# Patient Record
Sex: Female | Born: 1955 | Race: White | Hispanic: No | Marital: Married | Smoking: Never smoker
Health system: Southern US, Academic
[De-identification: ages and names within clinical notes are randomized; demographics above are authoritative.]

## PROBLEM LIST (undated history)

## (undated) DIAGNOSIS — J45909 Unspecified asthma, uncomplicated: Secondary | ICD-10-CM

## (undated) DIAGNOSIS — I10 Essential (primary) hypertension: Secondary | ICD-10-CM

## (undated) DIAGNOSIS — J449 Chronic obstructive pulmonary disease, unspecified: Secondary | ICD-10-CM

## (undated) DIAGNOSIS — K219 Gastro-esophageal reflux disease without esophagitis: Secondary | ICD-10-CM

## (undated) DIAGNOSIS — G43909 Migraine, unspecified, not intractable, without status migrainosus: Secondary | ICD-10-CM

## (undated) DIAGNOSIS — E119 Type 2 diabetes mellitus without complications: Secondary | ICD-10-CM

## (undated) DIAGNOSIS — J309 Allergic rhinitis, unspecified: Secondary | ICD-10-CM

## (undated) HISTORY — PX: HX OPEN CHOLECYSTECTOMY: SHX57

## (undated) HISTORY — PX: SINUS SURGERY: SHX187

## (undated) HISTORY — DX: Essential (primary) hypertension: I10

## (undated) HISTORY — DX: Chronic obstructive pulmonary disease, unspecified: J44.9

## (undated) HISTORY — PX: HX TONSILLECTOMY: SHX27

## (undated) HISTORY — DX: Unspecified asthma, uncomplicated: J45.909

## (undated) HISTORY — DX: Type 2 diabetes mellitus without complications (CMS HCC): E11.9

## (undated) HISTORY — DX: Gastro-esophageal reflux disease without esophagitis: K21.9

## (undated) HISTORY — DX: Migraine, unspecified, not intractable, without status migrainosus: G43.909

## (undated) HISTORY — DX: Allergic rhinitis, unspecified: J30.9

---

## 2005-09-25 ENCOUNTER — Other Ambulatory Visit (HOSPITAL_COMMUNITY): Payer: Self-pay | Admitting: FAMILY PRACTICE

## 2022-03-19 ENCOUNTER — Other Ambulatory Visit: Payer: 59 | Attending: FAMILY PRACTICE | Admitting: FAMILY PRACTICE

## 2022-03-19 DIAGNOSIS — B379 Candidiasis, unspecified: Secondary | ICD-10-CM | POA: Insufficient documentation

## 2022-03-20 ENCOUNTER — Other Ambulatory Visit: Payer: 59

## 2022-03-22 LAB — THROAT CULTURE, BETA HEMOLYTIC STREPTOCOCCUS: THROAT CULTURE: NORMAL

## 2022-03-24 ENCOUNTER — Other Ambulatory Visit (HOSPITAL_COMMUNITY): Payer: Self-pay | Admitting: FAMILY PRACTICE

## 2022-03-24 DIAGNOSIS — Z1231 Encounter for screening mammogram for malignant neoplasm of breast: Secondary | ICD-10-CM

## 2022-04-08 ENCOUNTER — Encounter (HOSPITAL_COMMUNITY): Payer: Self-pay

## 2022-04-08 ENCOUNTER — Inpatient Hospital Stay
Admission: RE | Admit: 2022-04-08 | Discharge: 2022-04-08 | Disposition: A | Discharge status: Home / Self Care | Payer: 59 | Source: Ambulatory Visit | Attending: FAMILY PRACTICE | Admitting: FAMILY PRACTICE

## 2022-04-08 ENCOUNTER — Other Ambulatory Visit: Payer: Self-pay

## 2022-04-08 DIAGNOSIS — Z1231 Encounter for screening mammogram for malignant neoplasm of breast: Secondary | ICD-10-CM | POA: Insufficient documentation

## 2022-05-26 ENCOUNTER — Other Ambulatory Visit (HOSPITAL_COMMUNITY): Payer: Self-pay | Admitting: Family

## 2022-05-26 DIAGNOSIS — R109 Unspecified abdominal pain: Secondary | ICD-10-CM

## 2022-06-11 ENCOUNTER — Ambulatory Visit (INDEPENDENT_AMBULATORY_CARE_PROVIDER_SITE_OTHER): Payer: 59 | Admitting: NURSE PRACTITIONER

## 2022-06-11 ENCOUNTER — Encounter (INDEPENDENT_AMBULATORY_CARE_PROVIDER_SITE_OTHER): Payer: Self-pay | Admitting: NURSE PRACTITIONER

## 2022-06-11 ENCOUNTER — Other Ambulatory Visit: Payer: Self-pay

## 2022-06-11 VITALS — Ht 64.0 in | Wt 180.0 lb

## 2022-06-11 DIAGNOSIS — K219 Gastro-esophageal reflux disease without esophagitis: Secondary | ICD-10-CM

## 2022-06-11 DIAGNOSIS — J300 Vasomotor rhinitis: Secondary | ICD-10-CM

## 2022-06-11 DIAGNOSIS — K143 Hypertrophy of tongue papillae: Secondary | ICD-10-CM

## 2022-06-11 MED ORDER — CLOTRIMAZOLE 10 MG TROCHE
10.0000 mg | Freq: Every evening | 1 refills | Status: DC
Start: 2022-06-11 — End: 2023-08-19

## 2022-06-11 MED ORDER — IPRATROPIUM BROMIDE 21 MCG (0.03 %) NASAL SPRAY
2.0000 | Freq: Two times a day (BID) | NASAL | 12 refills | Status: DC | PRN
Start: 2022-06-11 — End: 2022-07-03

## 2022-06-19 ENCOUNTER — Other Ambulatory Visit: Payer: Self-pay

## 2022-07-03 ENCOUNTER — Other Ambulatory Visit (INDEPENDENT_AMBULATORY_CARE_PROVIDER_SITE_OTHER): Payer: Self-pay | Admitting: NURSE PRACTITIONER

## 2022-07-09 ENCOUNTER — Other Ambulatory Visit: Payer: 59 | Attending: FAMILY PRACTICE

## 2022-07-09 ENCOUNTER — Other Ambulatory Visit: Payer: Self-pay

## 2022-07-09 DIAGNOSIS — R634 Abnormal weight loss: Secondary | ICD-10-CM | POA: Insufficient documentation

## 2022-07-09 DIAGNOSIS — T7840XA Allergy, unspecified, initial encounter: Secondary | ICD-10-CM | POA: Insufficient documentation

## 2022-07-09 DIAGNOSIS — I10 Essential (primary) hypertension: Secondary | ICD-10-CM | POA: Insufficient documentation

## 2022-07-09 DIAGNOSIS — E119 Type 2 diabetes mellitus without complications: Secondary | ICD-10-CM | POA: Insufficient documentation

## 2022-07-09 LAB — BASIC METABOLIC PANEL
ANION GAP: 9 mmol/L — ABNORMAL LOW (ref 10–20)
BUN/CREA RATIO: 19 (ref 6–22)
BUN: 19 mg/dL (ref 7–25)
CALCIUM: 9.7 mg/dL (ref 8.6–10.3)
CHLORIDE: 105 mmol/L (ref 98–107)
CO2 TOTAL: 25 mmol/L (ref 21–31)
CREATININE: 0.98 mg/dL (ref 0.60–1.30)
ESTIMATED GFR: 64 mL/min/{1.73_m2} (ref >59)
GLUCOSE: 91 mg/dL (ref 74–109)
OSMOLALITY, CALCULATED: 279 mOsm/kg (ref 270–290)
POTASSIUM: 4.2 mmol/L (ref 3.5–5.1)
SODIUM: 139 mmol/L (ref 136–145)

## 2022-07-09 LAB — HEPATIC FUNCTION PANEL
ALBUMIN/GLOBULIN RATIO: 1.8 — ABNORMAL HIGH (ref 0.8–1.4)
ALBUMIN: 4.4 g/dL (ref 3.5–5.7)
ALKALINE PHOSPHATASE: 69 U/L (ref 34–104)
ALT (SGPT): 25 U/L (ref 7–52)
AST (SGOT): 18 U/L (ref 13–39)
BILIRUBIN DIRECT: 0.06 md/dL (ref <=0.20)
BILIRUBIN TOTAL: 0.3 mg/dL (ref 0.3–1.2)
BILIRUBIN, INDIRECT: 0.24 mg/dL (ref <=1)
GLOBULIN: 2.4 — ABNORMAL LOW (ref 2.9–5.4)
PROTEIN TOTAL: 6.8 g/dL (ref 6.4–8.9)

## 2022-07-09 LAB — THYROID STIMULATING HORMONE (SENSITIVE TSH): TSH: 2.539 u[IU]/mL (ref 0.450–5.330)

## 2022-07-10 LAB — HGA1C (HEMOGLOBIN A1C WITH EST AVG GLUCOSE): HEMOGLOBIN A1C: 5.4 % (ref 4.0–6.0)

## 2022-07-29 ENCOUNTER — Encounter (INDEPENDENT_AMBULATORY_CARE_PROVIDER_SITE_OTHER): Payer: Self-pay | Admitting: NURSE PRACTITIONER

## 2022-08-05 ENCOUNTER — Ambulatory Visit (HOSPITAL_COMMUNITY): Payer: Self-pay

## 2022-08-26 ENCOUNTER — Other Ambulatory Visit: Payer: Self-pay

## 2022-08-26 ENCOUNTER — Ambulatory Visit (INDEPENDENT_AMBULATORY_CARE_PROVIDER_SITE_OTHER): Payer: 59 | Admitting: NURSE PRACTITIONER

## 2022-08-26 ENCOUNTER — Encounter (INDEPENDENT_AMBULATORY_CARE_PROVIDER_SITE_OTHER): Payer: Self-pay | Admitting: NURSE PRACTITIONER

## 2022-08-26 VITALS — Ht 64.0 in | Wt 180.0 lb

## 2022-08-26 DIAGNOSIS — J300 Vasomotor rhinitis: Secondary | ICD-10-CM

## 2022-08-26 DIAGNOSIS — K219 Gastro-esophageal reflux disease without esophagitis: Secondary | ICD-10-CM

## 2022-08-26 DIAGNOSIS — K146 Glossodynia: Secondary | ICD-10-CM

## 2022-08-26 MED ORDER — DESLORATADINE 5 MG TABLET
5.0000 mg | ORAL_TABLET | Freq: Every day | ORAL | 3 refills | Status: DC
Start: 2022-08-26 — End: 2022-09-17

## 2022-08-26 NOTE — Progress Notes (Signed)
ENT, [facility]  [address]  [place] [address]  Phone: [phone]  Fax: [phone]      Encounter Date: 08/26/2022    Patient ID: Heather Atkins  MRN: [id number]    DOB: 1956-10-09  Age: 66 y.o. female     Progress Note       Referring Provider:  No ref. provider found    Reason for Visit:   Chief Complaint   Patient presents with    Acid Reflux     6 week rc, patient complains of PND, sore throat, hoarseness and cough        History of Present Illness:  Heather Atkins is a 66 y.o. female follow up burning tongue syndrome.  Last visit I started her on Clotrimazole lozenges nightly.  Continues to have a burning sensation in mouth and throat at times.  Laryngoscopy showed LPR changes and advised to continue management as directed by GI.  I also started her on Atrovent nasal spray for runny nose and this has helped some.    06/11/22  History of Present Illness:  Heather Atkins is a 66 y.o. female referred for burning mouth syndrome.  She complains of a film on tongue for 3 months.  Treated with nystatin several times and no improvement.  She has history of chronic sinus symptoms most of her life and feels that PND could be causing film on her tongue.  Has been taking Singulair for several years and unsure if this helping.  Allergy testing in January by Dr. Sherilyn Banker was positive to a few molds.  Diagnosed with Barrett's Esophagus EGD March Dr. Allena Katz and follow up in September. Last A1C 5.5  Patient History:  There is no problem list on file for this patient.    Current Outpatient Medications   Medication Sig    allopurinoL (ZYLOPRIM) 100 mg Oral Tablet Take 1 Tablet (100 mg total) by mouth Once a day    amitriptyline (ELAVIL) 50 mg Oral Tablet Take 1 Tablet (50 mg total) by mouth Every night    atorvastatin (LIPITOR) 20 mg Oral Tablet Take 1 Tablet (20 mg total) by mouth Once a day    Butalbital-Acetaminophen-Caff 50-300-40 mg Oral Capsule TAKE 1 CAPSULE BY MOUTH TWICE A DAY AS NEEDED    clobetasoL  (CORMAX) 0.05 % Solution APPLY TO AREAS ON SCALP AT BEDTIME    clotrimazole (MYCELEX) 10 mg Mucous Membrane Troche Take 1 Troche (10 mg total) by mouth Every night    desloratadine (CLARINEX) 5 mg Oral Tablet Take 1 Tablet (5 mg total) by mouth Once a day    famotidine (PEPCID) 40 mg Oral Tablet Take 1 Tablet (40 mg total) by mouth Every morning    ipratropium bromide (ATROVENT) 21 mcg (0.03 %) Nasal nasal spray ADMINISTER 2 SPRAYS INTO AFFECTED NOSTRIL(S) TWICE PER DAY AS NEEDED    losartan (COZAAR) 50 mg Oral Tablet Take 1 Tablet (50 mg total) by mouth Twice daily    metFORMIN (GLUCOPHAGE) 500 mg Oral Tablet Take 1 Tablet (500 mg total) by mouth Twice daily    montelukast (SINGULAIR) 10 mg Oral Tablet Take 1 Tablet (10 mg total) by mouth Once a day    omeprazole (PRILOSEC) 40 mg Oral Capsule, Delayed Release(E.C.) TAKE 1 CAPSULE BY MOUTH EVERY DAY BEFORE DINNER      Allergies   Allergen Reactions    Cat Dander     Cockroach     Dog Dander     Grass Pollen  Mold     Tetanus Vaccines And Toxoid     Tree And Shrub Pollen      Past Medical History:   Diagnosis Date    Asthma     COPD (chronic obstructive pulmonary disease) (CMS HCC)     Diabetes mellitus (CMS HCC)     Esophageal reflux     Essential hypertension     Migraine      Past Surgical History:   Procedure Laterality Date    HX OPEN CHOLECYSTECTOMY      HX TONSILLECTOMY      SINUS SURGERY       Family Medical History:       Problem Relation (Age of Onset)    Asthma Other    Breast Cancer Paternal Aunt    Cancer Other    Hearing Loss Other    Migraines Other    Sleep apnea Other    Thyroid Disease Other            Social History     Tobacco Use    Smoking status: Never    Smokeless tobacco: Never       Review of Systems     Vitals:    08/26/22 0913   Weight: 81.6 kg (180 lb)   Height: 1.626 m (5\' 4" )   BMI: 30.96      ENT Physical Exam  Constitutional  Appearance: patient appears well-developed, well-nourished and well-groomed,  Communication/Voice:  communication appropriate for developmental age; vocal quality normal;  Head and Face  Appearance: head appears normal, face appears normal and face appears atraumatic;  Palpation: facial palpation normal;  Salivary: glands normal;  Ear  Hearing: intact;  Auricles: right auricle normal; left auricle normal;  External Mastoids: right external mastoid normal; left external mastoid normal;  Ear Canals: right ear canal normal; left ear canal normal;  Tympanic Membranes: right tympanic membrane normal; left tympanic membrane normal;  Nose  External Nose: nares patent bilaterally; external nose normal;  Internal Nose: nasal mucosa normal; septum normal; bilateral inferior turbinates normal;  Oral Cavity/Oropharynx  Lips: normal;  Teeth: normal;  Gums: gingiva normal;  Tongue: normal;  Oral mucosa: normal;  Hard palate: normal;  OC/OP comments: Tongue with slight coating and papillae hypertrophy  Neck  Neck: neck normal; neck palpation normal;  Thyroid: thyroid normal;  Respiratory  Inspection: breathing unlabored; normal breathing rate;  Lymphatic  Palpation: lymph nodes normal;  Neurovestibular  Mental Status: alert and oriented;  Psychiatric: mood normal; affect is appropriate;  Cranial Nerves: cranial nerves intact;       Assessment:  ENCOUNTER DIAGNOSES     ICD-10-CM   1. Burning tongue syndrome  K14.6   2. 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  Labs as ordered for burning tongue syndrome, will call her with results  Clarinex 5 mg daily for AR symptoms, can continue Atrovent as needed  Avoid sugar in diet as this could be contributing to burning tongue syndrome      Orders Placed This Encounter    GLUCOSE FASTING    VITAMIN B6 (PYRIDOXAL 5-PHOSPHATE), PLASMA    ZINC, SERUM    THYROID STIMULATING HORMONE (SENSITIVE TSH)    VITAMIN D 25 TOTAL    VITAMIN B12    IRON    desloratadine (CLARINEX) 5 mg Oral Tablet     No follow-ups on  file.  Elnora Morrison, FNP-BC  08/26/2022, 09:33

## 2022-09-11 IMAGING — US US ABDOMEN COMPLETE
1 series · 14 of 25 positions shown · non-contrast
Comparison: None available.

﻿EXAM: US ABDOMEN COMPLETE
INDICATION: Abdominal pain.

[Series 1: us abdomen complete · 14 of 61 slices shown]
[im 1/61]
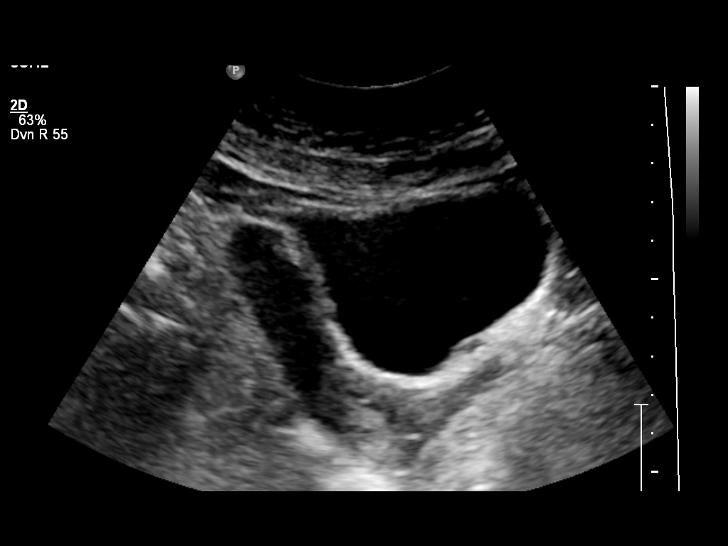
[im 6/61]
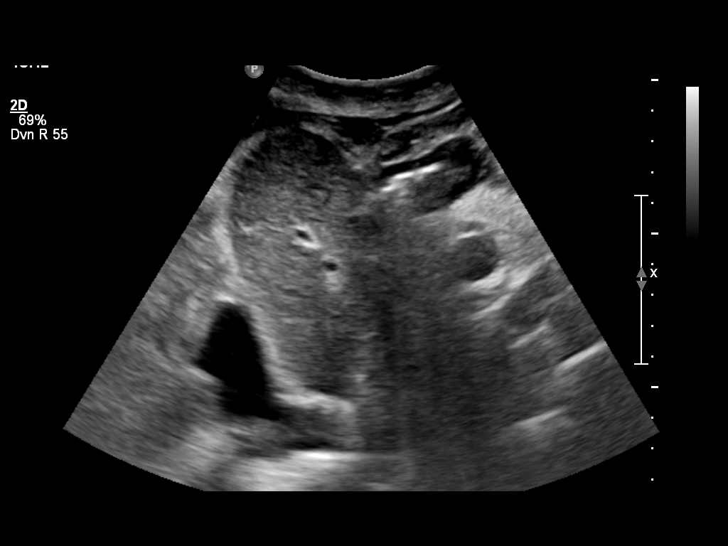
[im 11/61]
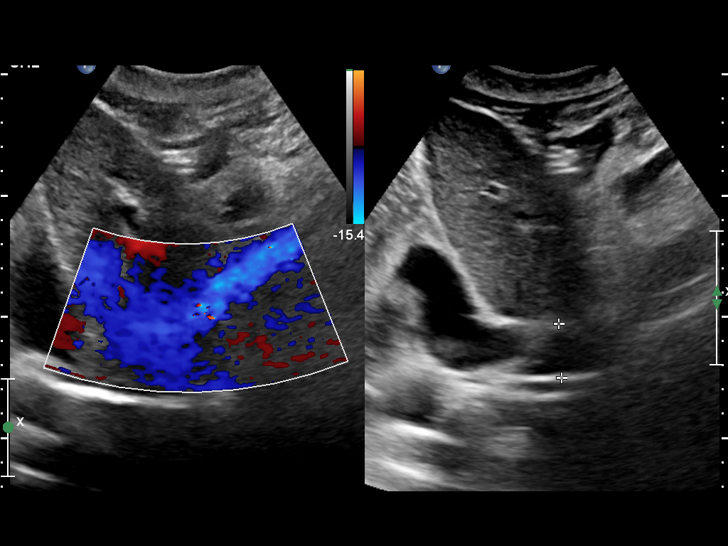
[im 16/61]
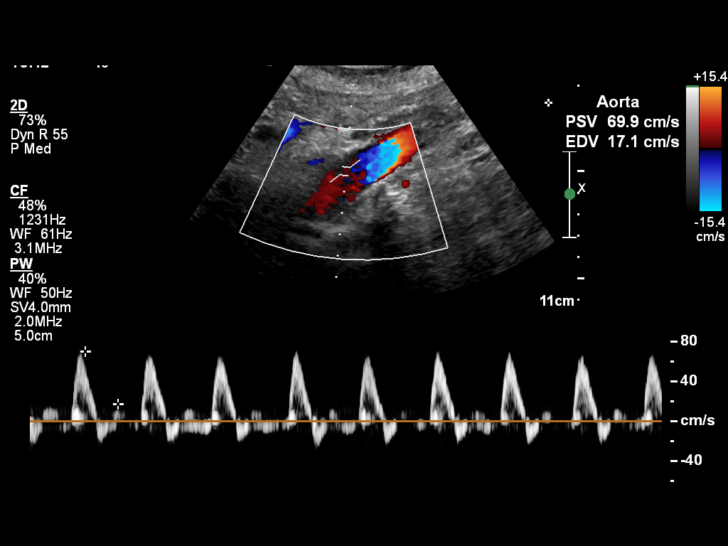
[im 21/61]
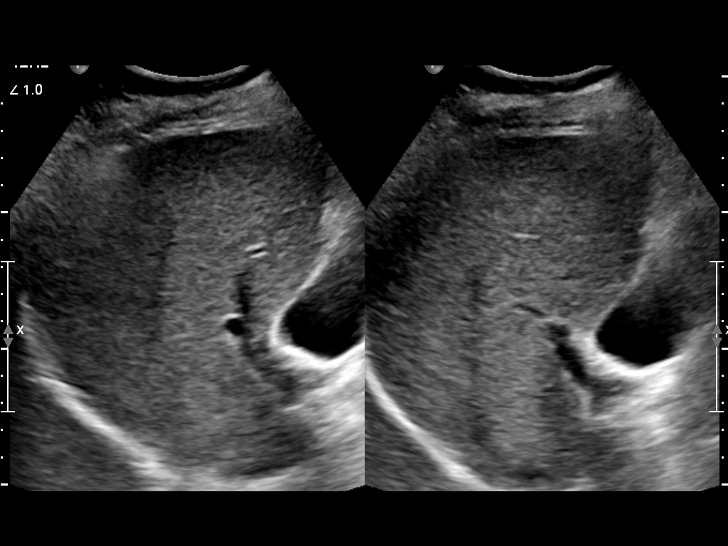
[im 23/61]
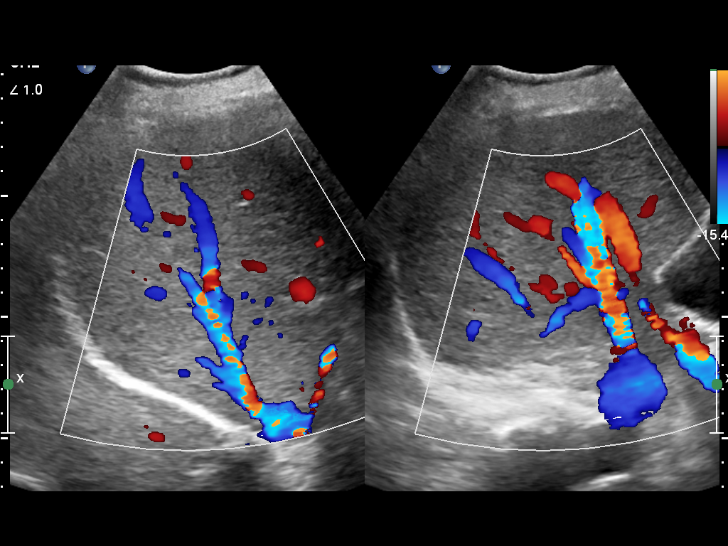
[im 28/61]
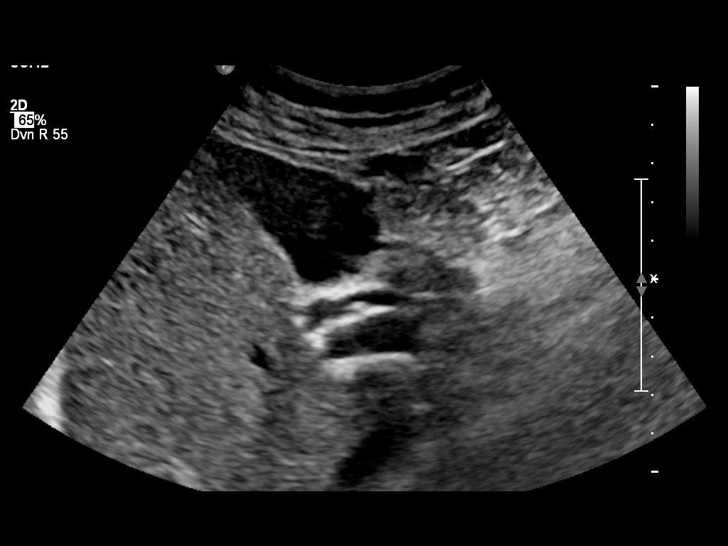
[im 33/61]
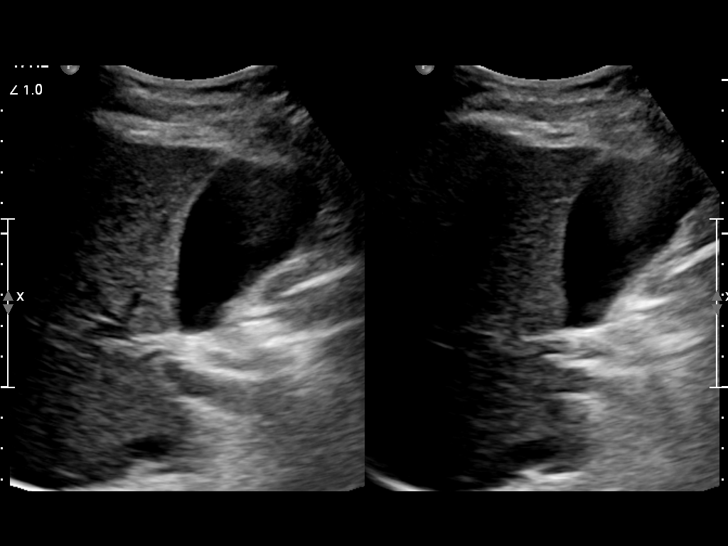
[im 38/61]
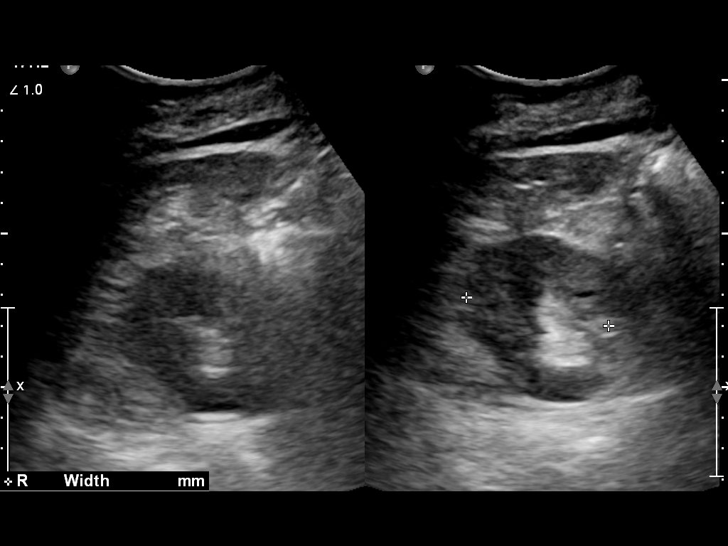
[im 41/61]
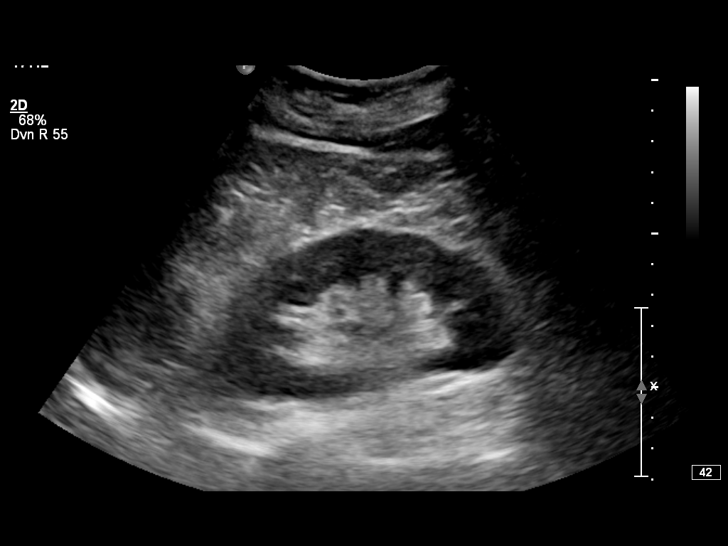
[im 46/61]
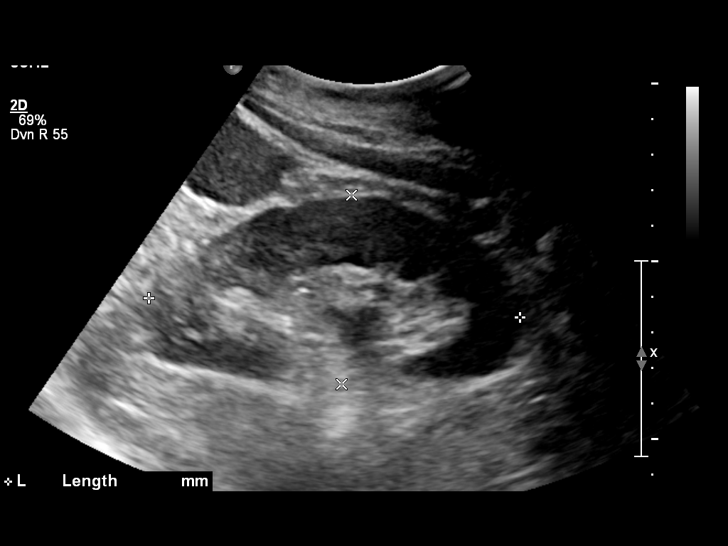
[im 51/61]
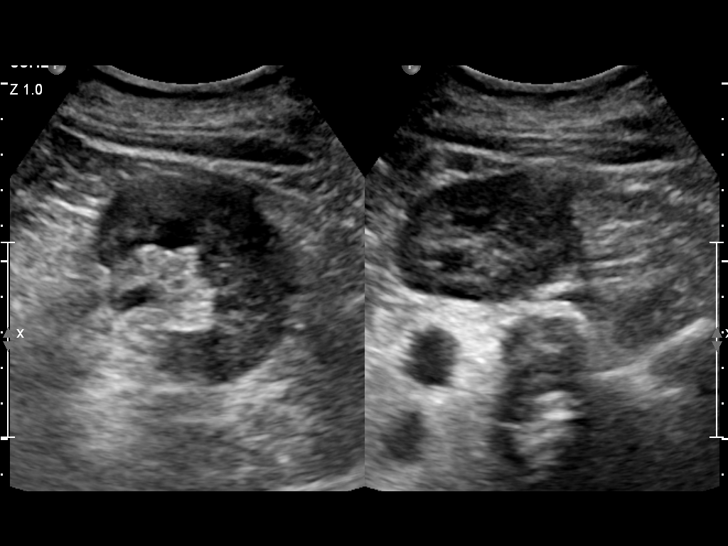
[im 56/61]
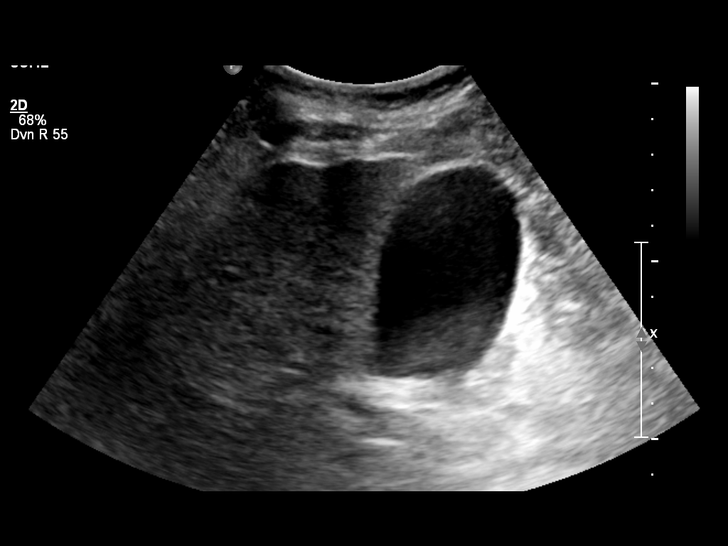
[im 61/61]
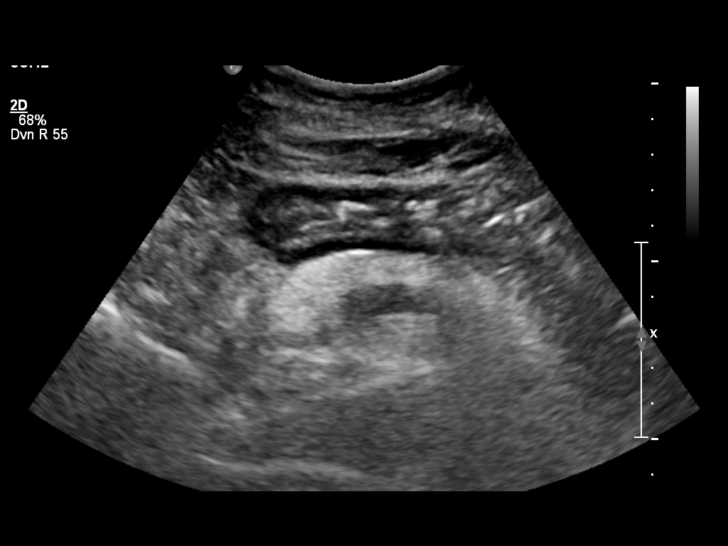

[14 of 25 positions shown; findings below may reference images not displayed]

FINDINGS: No abnormalities of the gallbladder. Extrahepatic bile ducts are normal in size.

Mildly contracted liver measuring 13 cm in craniocaudal span.  Heterogeneous echotexture of liver and suspected nodular surface of liver. Portal vein and hepatic veins are patent.

Spleen size is normal measuring 11.5 cm in length.  Kidneys are normal. Aorta, vena cava and pancreas are unremarkable to the extent visualized.  There is no ascites.
IMPRESSION: 1. No abnormalities of gallbladder and extrahepatic bile ducts.

2. Mildly contracted liver with heterogeneous echotexture.  Suspected nodular surface of liver.  Portal vein and hepatic veins are patent.  Possible changes of chronic liver disease.  Please correlate with liver function results.

3. Normal size spleen. No free fluid noted.

## 2022-09-17 ENCOUNTER — Other Ambulatory Visit (INDEPENDENT_AMBULATORY_CARE_PROVIDER_SITE_OTHER): Payer: Self-pay | Admitting: NURSE PRACTITIONER

## 2022-09-24 ENCOUNTER — Other Ambulatory Visit: Payer: 59 | Attending: FAMILY PRACTICE

## 2022-09-24 ENCOUNTER — Other Ambulatory Visit: Payer: Self-pay

## 2022-09-24 DIAGNOSIS — Z01419 Encounter for gynecological examination (general) (routine) without abnormal findings: Secondary | ICD-10-CM | POA: Insufficient documentation

## 2022-10-14 LAB — THIN PREP PAP AND HPV MRNA (QUEST): HPV MRNA E6/E7: NOT DETECTED

## 2022-10-17 ENCOUNTER — Ambulatory Visit (HOSPITAL_COMMUNITY): Payer: Self-pay

## 2023-01-11 ENCOUNTER — Other Ambulatory Visit (INDEPENDENT_AMBULATORY_CARE_PROVIDER_SITE_OTHER): Payer: Self-pay | Admitting: NURSE PRACTITIONER

## 2023-02-13 ENCOUNTER — Other Ambulatory Visit (HOSPITAL_COMMUNITY): Payer: Self-pay | Admitting: FAMILY PRACTICE

## 2023-02-13 ENCOUNTER — Other Ambulatory Visit: Payer: 59 | Attending: FAMILY PRACTICE

## 2023-02-13 ENCOUNTER — Other Ambulatory Visit: Payer: Self-pay

## 2023-02-13 DIAGNOSIS — Z79899 Other long term (current) drug therapy: Secondary | ICD-10-CM | POA: Insufficient documentation

## 2023-02-13 DIAGNOSIS — Z1231 Encounter for screening mammogram for malignant neoplasm of breast: Secondary | ICD-10-CM

## 2023-02-13 LAB — DRUG SCREEN, NO CONFIRMATION, URINE
AMPHET QL: NEGATIVE
BARB QL: NEGATIVE
BENZO QL: NEGATIVE
BUP QL: NEGATIVE
CANNAQL: NEGATIVE
COCQL: NEGATIVE
FENTANYL, RANDOM URINE: NEGATIVE
METHQL: NEGATIVE
OPIATE: NEGATIVE
OXYCODONE URINE: NEGATIVE
PCP QL: NEGATIVE

## 2023-02-23 ENCOUNTER — Other Ambulatory Visit (HOSPITAL_COMMUNITY): Payer: Self-pay | Admitting: FAMILY PRACTICE

## 2023-02-23 DIAGNOSIS — M25512 Pain in left shoulder: Secondary | ICD-10-CM

## 2023-03-04 ENCOUNTER — Other Ambulatory Visit (HOSPITAL_COMMUNITY): Payer: 59

## 2023-03-05 ENCOUNTER — Encounter (HOSPITAL_COMMUNITY): Payer: Self-pay

## 2023-03-09 IMAGING — MR MRI SHOULDER LT W/O CONTRAST
6 series · 39 of 40 positions shown · IV contrast (gadolinium)
Comparison: None available.

﻿EXAM:  [id number]   MRI SHOULDER LT W/O CONTRAST
INDICATION: Left shoulder pain. Diminished range of motion. Trauma due to fall 1 month ago.
TECHNIQUE: Multiplanar, multisequential MRI of the left shoulder was performed without gadolinium contrast.

[Series 6: T1 · oblique · left · 3.5mm · 0.33mm/px · 7 of 18 slices shown]
[im 1/18]
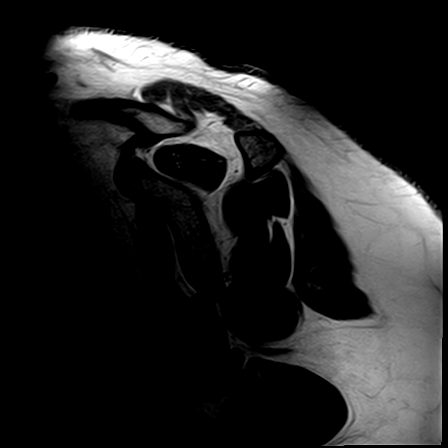
[im 3/18]
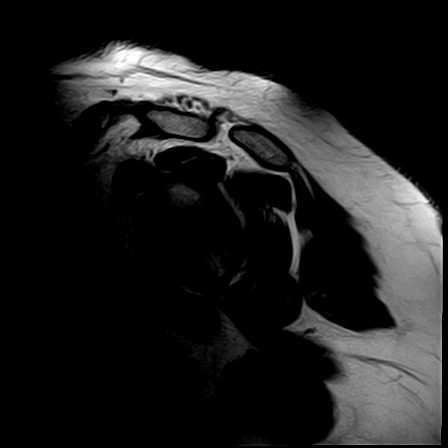
[im 6/18]
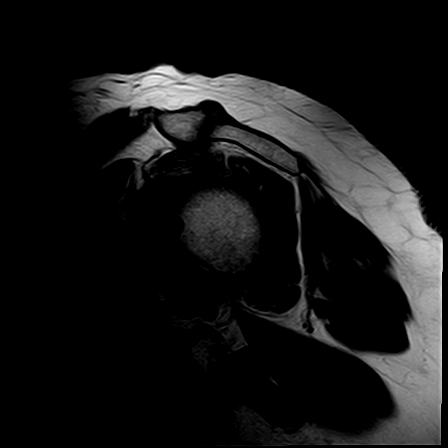
[im 9/18]
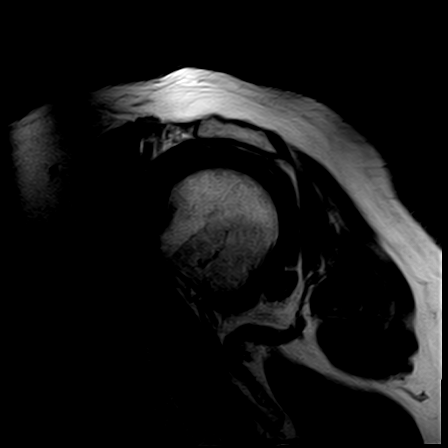
[im 12/18]
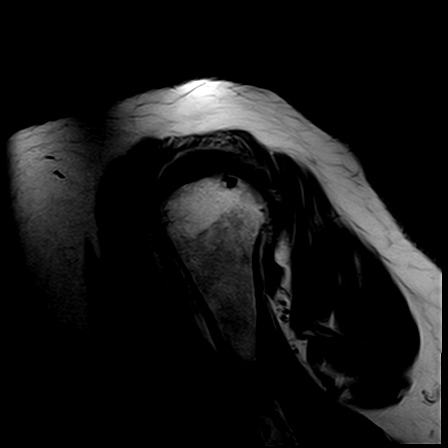
[im 15/18]
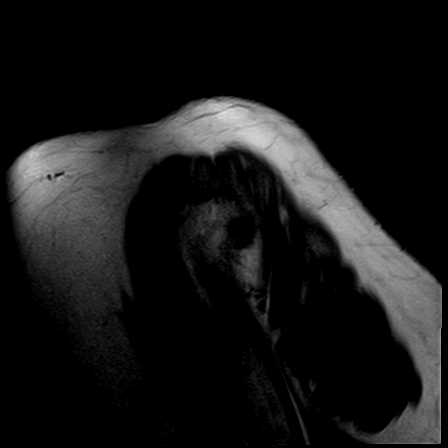
[im 18/18]
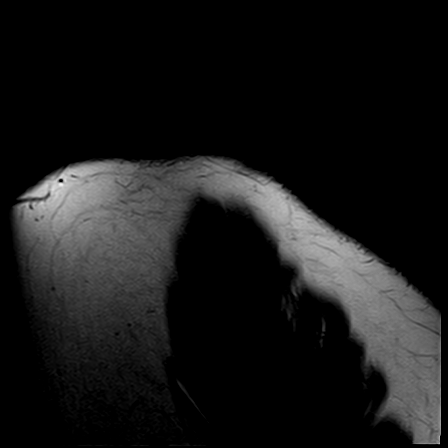

[Series 7: STIR · oblique · left · 3.5mm · 0.47mm/px · 7 of 18 slices shown (1 of 2)]
[im 1/18]
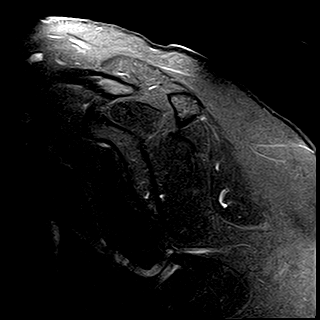
[im 3/18]
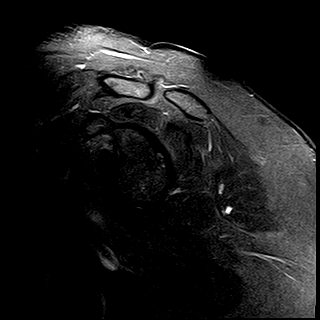
[im 6/18]
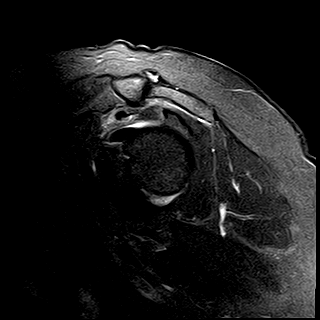
[im 9/18]
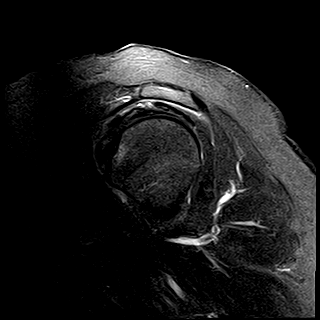
[im 12/18]
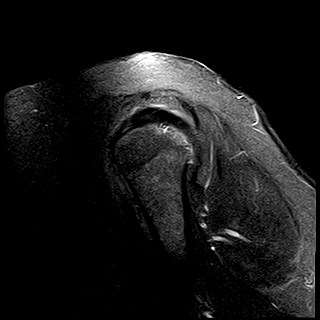
[im 15/18]
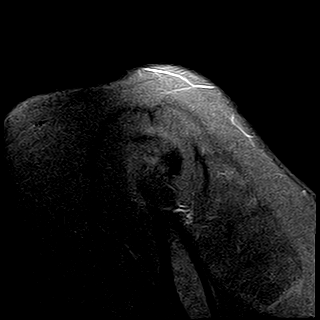
[im 18/18]
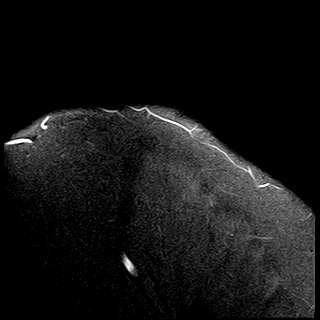

[Series 8: PD fat-sat · axial · left · 4.0mm · 0.50mm/px · z∈[-49,+26]mm · 6 of 18 slices shown (1 of 2)]
[im 1/18]
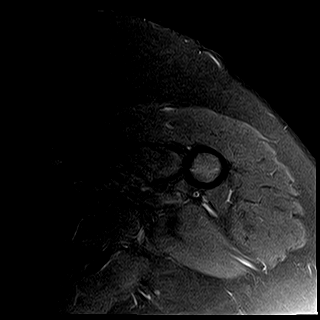
[im 4/18]
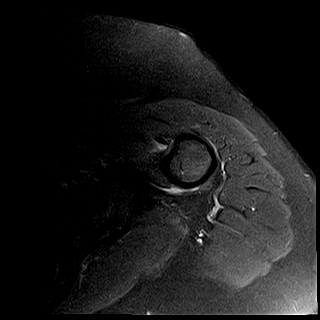
[im 7/18]
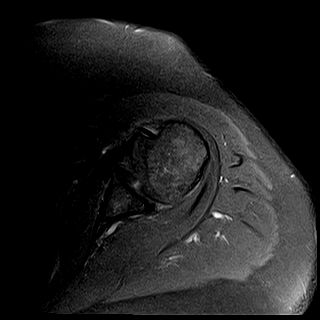
[im 11/18]
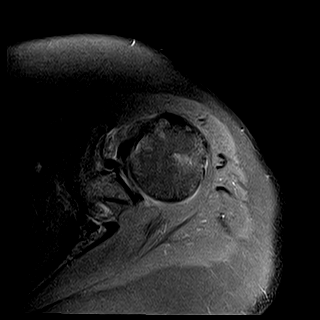
[im 14/18]
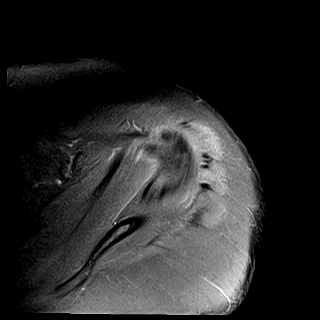
[im 18/18]
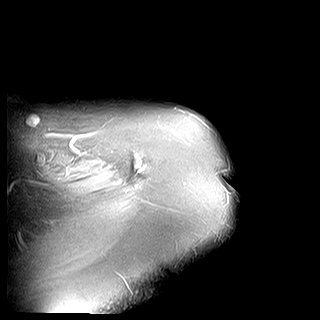

[Series 9: T2 fat-sat · axial · left · 4.0mm · 0.42mm/px · z∈[-63,+39]mm · 8 of 24 slices shown]
[im 1/24]
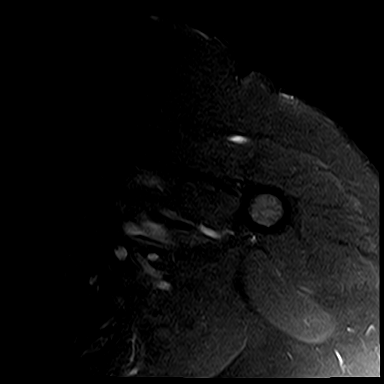
[im 4/24]
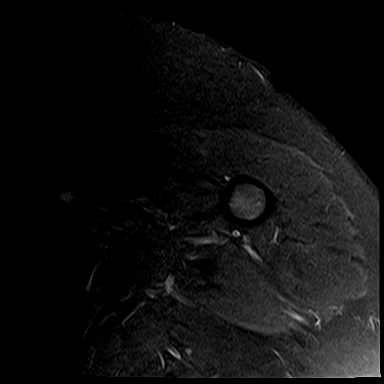
[im 7/24]
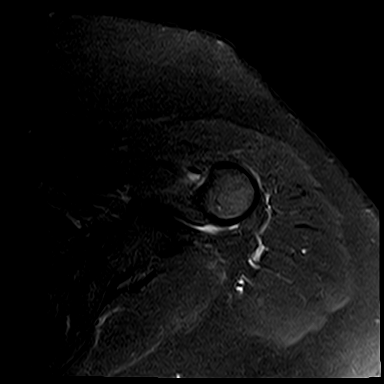
[im 10/24]
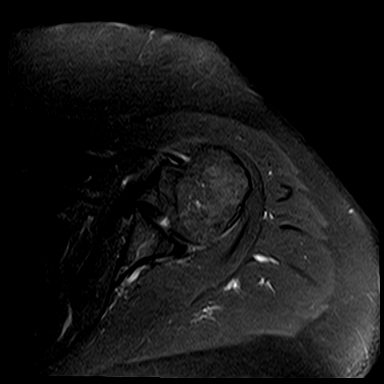
[im 14/24]
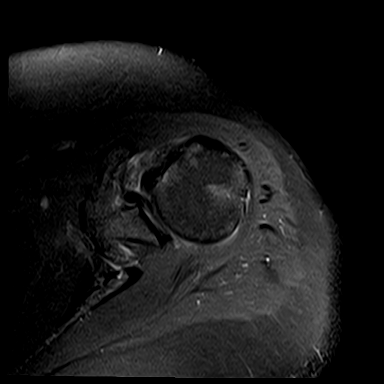
[im 17/24]
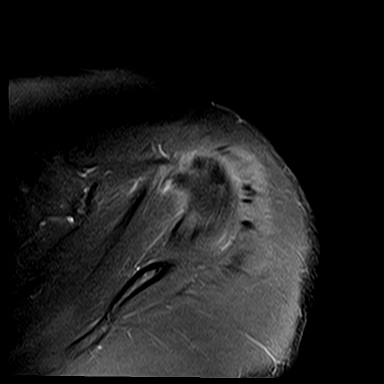
[im 20/24]
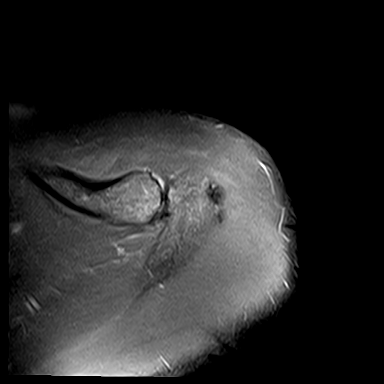
[im 24/24]
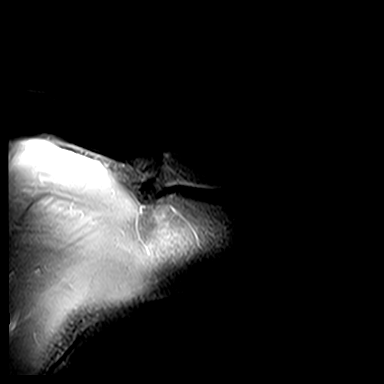

[Series 10: PD fat-sat · oblique · left · 3.5mm · 0.47mm/px · 6 of 18 slices shown (2 of 2)]
[im 1/18]
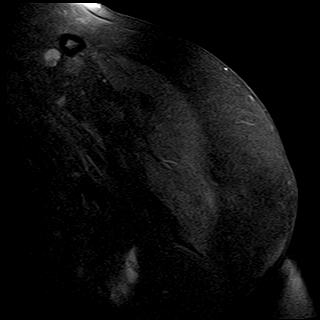
[im 4/18]
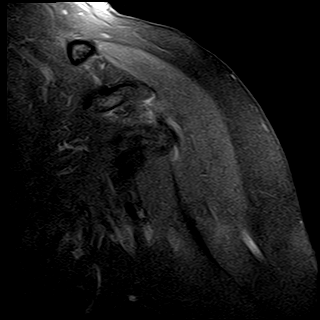
[im 7/18]
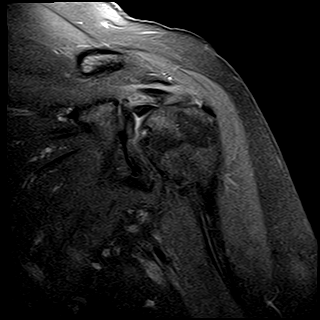
[im 11/18]
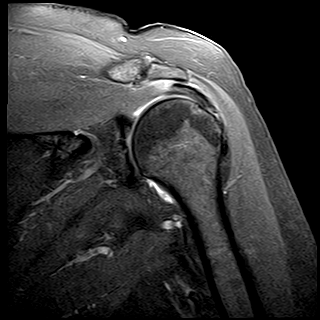
[im 14/18]
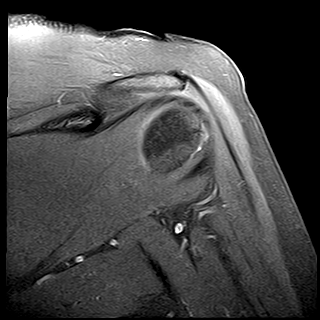
[im 18/18]
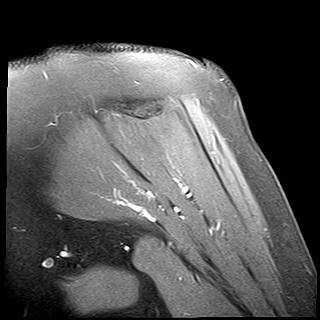

[Series 11: STIR · oblique · left · 3.5mm · 0.47mm/px · 5 of 18 slices shown (2 of 2)]
[im 1/18]
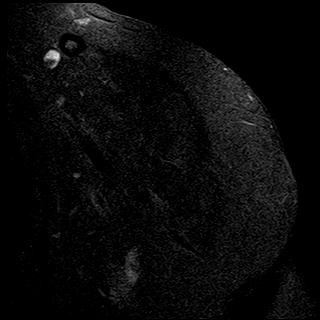
[im 4/18]
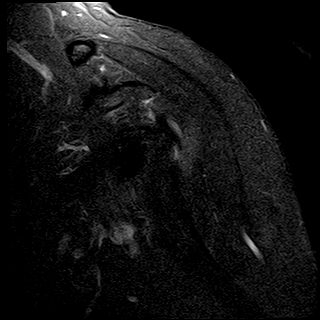
[im 7/18]
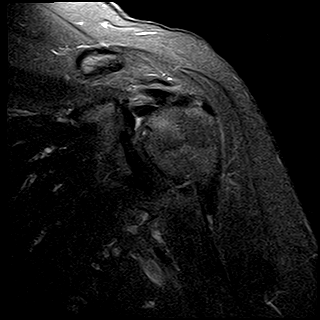
[im 11/18]
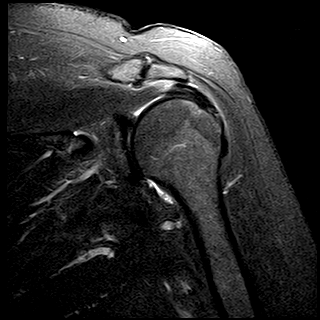
[im 14/18]
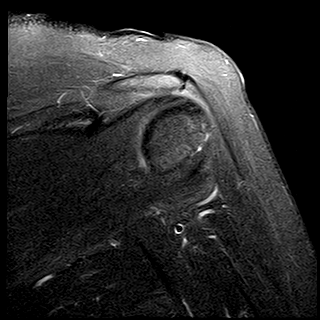

[39 of 40 positions shown; findings below may reference images not displayed]

FINDINGS: No acute fractures or bone bruises are seen at the left shoulder.

Degenerative changes of AC joint are impinging on the subacromion space and the supraspinatus.  Mild supraspinatus tendinitis is noted.  No evidence of rotator cuff tear.

Mild degenerative changes of glenohumeral articulation.  No evidence of glenoid labral tear.  Long head of biceps tendon is intact.
IMPRESSION: 1. No acute bone changes at the left shoulder. 

2. Degenerative changes of AC joint mildly impinging on the supraspinatus in the subacromion region.  Mild supraspinatus tendinopathy.  No evidence of rotator cuff tear.

3. Degenerative changes of glenohumeral joint.  No evidence of labral tear.

## 2023-04-13 ENCOUNTER — Ambulatory Visit (HOSPITAL_COMMUNITY): Payer: Self-pay

## 2023-04-22 ENCOUNTER — Inpatient Hospital Stay
Admission: RE | Admit: 2023-04-22 | Discharge: 2023-04-22 | Disposition: A | Discharge status: Home / Self Care | Payer: 59 | Source: Ambulatory Visit | Attending: FAMILY PRACTICE | Admitting: FAMILY PRACTICE

## 2023-04-22 ENCOUNTER — Encounter (HOSPITAL_COMMUNITY): Payer: Self-pay

## 2023-04-22 ENCOUNTER — Other Ambulatory Visit: Payer: Self-pay

## 2023-04-22 DIAGNOSIS — Z1231 Encounter for screening mammogram for malignant neoplasm of breast: Secondary | ICD-10-CM | POA: Insufficient documentation

## 2023-06-24 ENCOUNTER — Inpatient Hospital Stay
Admission: RE | Admit: 2023-06-24 | Discharge: 2023-06-24 | Disposition: A | Discharge status: Home / Self Care | Payer: 59 | Source: Ambulatory Visit | Attending: FAMILY PRACTICE | Admitting: FAMILY PRACTICE

## 2023-06-24 ENCOUNTER — Other Ambulatory Visit: Payer: Self-pay

## 2023-06-24 ENCOUNTER — Other Ambulatory Visit (HOSPITAL_COMMUNITY): Payer: Self-pay | Admitting: FAMILY PRACTICE

## 2023-06-24 ENCOUNTER — Other Ambulatory Visit (HOSPITAL_COMMUNITY): Payer: 59 | Admitting: FAMILY PRACTICE

## 2023-06-24 ENCOUNTER — Ambulatory Visit (INDEPENDENT_AMBULATORY_CARE_PROVIDER_SITE_OTHER): Payer: Self-pay

## 2023-06-24 ENCOUNTER — Encounter (INDEPENDENT_AMBULATORY_CARE_PROVIDER_SITE_OTHER): Payer: Self-pay

## 2023-06-24 DIAGNOSIS — R609 Edema, unspecified: Secondary | ICD-10-CM | POA: Insufficient documentation

## 2023-06-24 DIAGNOSIS — F419 Anxiety disorder, unspecified: Secondary | ICD-10-CM | POA: Insufficient documentation

## 2023-06-24 DIAGNOSIS — M25579 Pain in unspecified ankle and joints of unspecified foot: Secondary | ICD-10-CM

## 2023-06-24 DIAGNOSIS — Z8269 Family history of other diseases of the musculoskeletal system and connective tissue: Secondary | ICD-10-CM | POA: Insufficient documentation

## 2023-06-24 DIAGNOSIS — K219 Gastro-esophageal reflux disease without esophagitis: Secondary | ICD-10-CM | POA: Insufficient documentation

## 2023-06-24 DIAGNOSIS — M25473 Effusion, unspecified ankle: Secondary | ICD-10-CM | POA: Insufficient documentation

## 2023-06-24 LAB — CBC WITH DIFF
BASOPHIL #: 0.1 10*3/uL (ref 0.00–0.10)
BASOPHIL %: 1 % (ref 0–1)
EOSINOPHIL #: 0.1 10*3/uL (ref 0.00–0.50)
EOSINOPHIL %: 2 %
HCT: 43.1 % — ABNORMAL HIGH (ref 31.2–41.9)
HGB: 14.5 g/dL — ABNORMAL HIGH (ref 10.9–14.3)
LYMPHOCYTE #: 2.1 10*3/uL (ref 1.00–3.00)
LYMPHOCYTE %: 34 % (ref 16–44)
MCH: 32 pg (ref 24.7–32.8)
MCHC: 33.7 g/dL (ref 32.3–35.6)
MCV: 95 fL (ref 75.5–95.3)
MONOCYTE #: 0.4 10*3/uL (ref 0.30–1.00)
MONOCYTE %: 7 % (ref 5–13)
MPV: 9.2 fL (ref 7.9–10.8)
NEUTROPHIL #: 3.4 10*3/uL (ref 1.85–7.80)
NEUTROPHIL %: 56 % (ref 43–77)
PLATELETS: 227 10*3/uL (ref 140–440)
RBC: 4.54 10*6/uL (ref 3.63–4.92)
RDW: 12.4 % (ref 12.3–17.7)
WBC: 6.1 10*3/uL (ref 3.8–11.8)

## 2023-06-24 LAB — URIC ACID: URIC ACID: 7.3 mg/dL (ref 2.3–7.6)

## 2023-07-14 IMAGING — MR MRI ANKLE LT WO CONTAST
4 of 6 series · 23 of 40 positions shown · IV contrast (gadolinium)
Comparison: Outside x-rays dated 06/24/2023.

﻿EXAM:  [id number]   MRI ANKLE LT WO CONTAST
INDICATION: 67-year-old with persistent medial left ankle pain and swelling.  No history of trauma or previous surgery of the ankle.
TECHNIQUE: Multiplanar, multisequential MRI of the left ankle was performed without gadolinium contrast.

[Series 8: T1 · sagittal · left · 3.5mm · 0.38mm/px · 5 of 20 slices shown (1 of 3)]
[im 1/20]
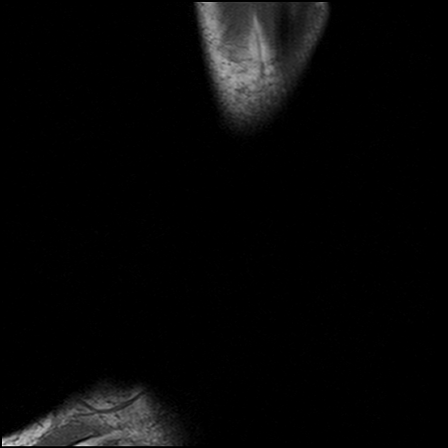
[im 5/20]
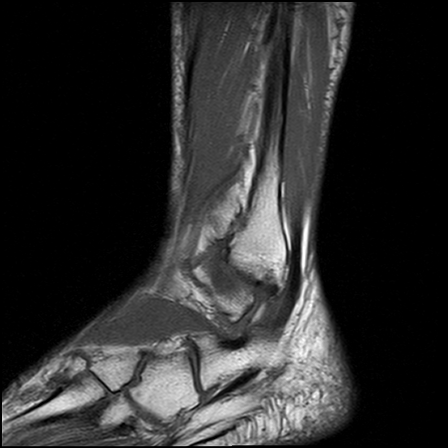
[im 10/20]
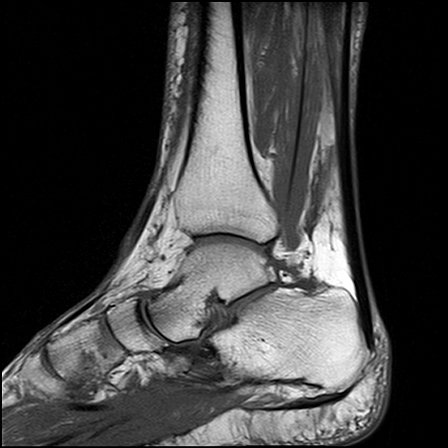
[im 15/20]
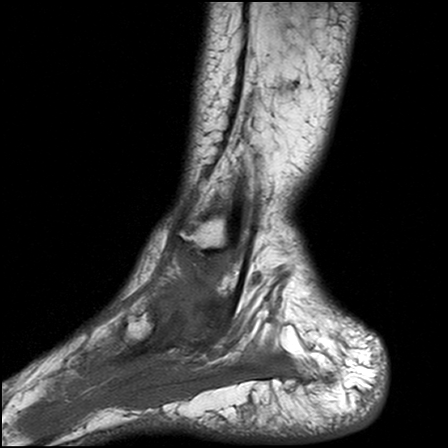
[im 20/20]
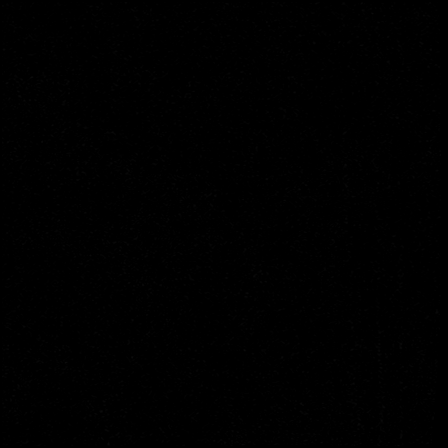

[Series 10: T1 · axial · left · 4.5mm · 0.33mm/px · z∈[-95,+20]mm · 7 of 28 slices shown (2 of 3)]
[im 1/28]
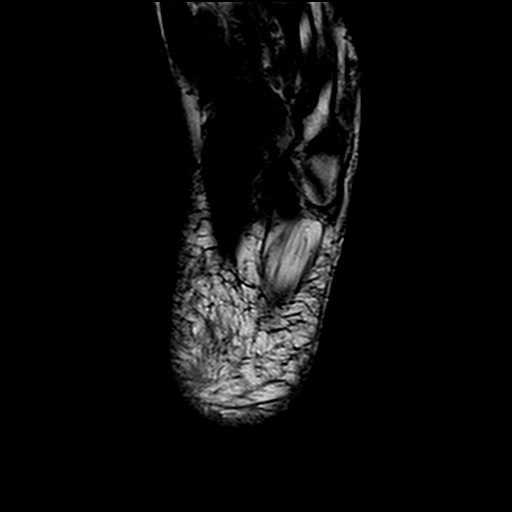
[im 4/28]
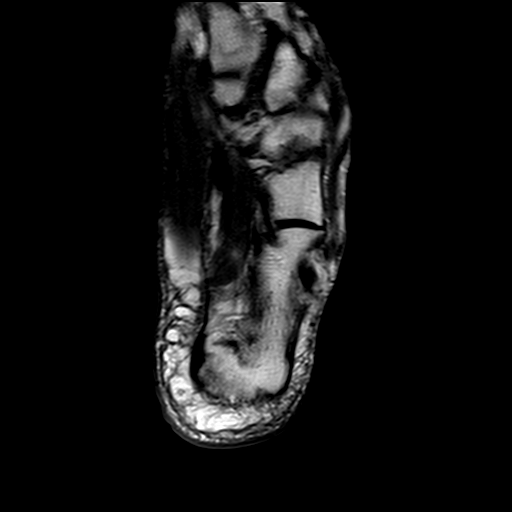
[im 8/28]
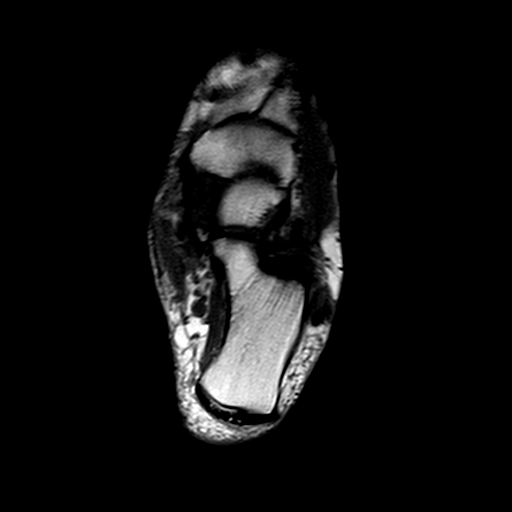
[im 12/28]
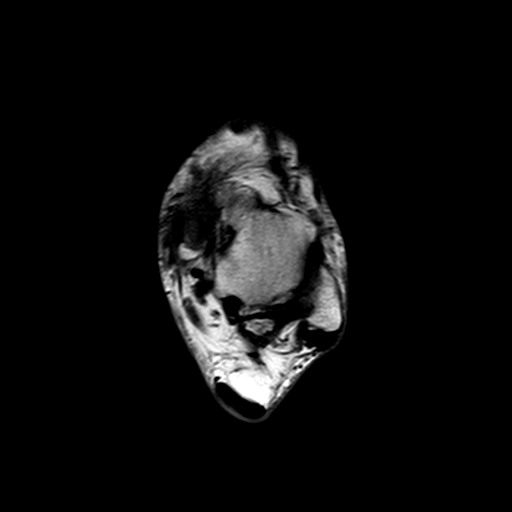
[im 16/28]
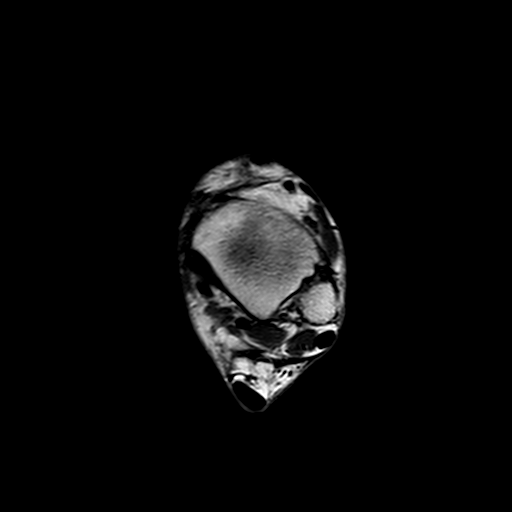
[im 20/28]
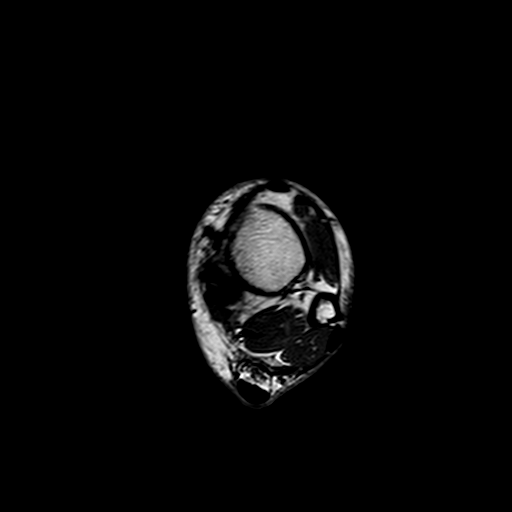
[im 24/28]
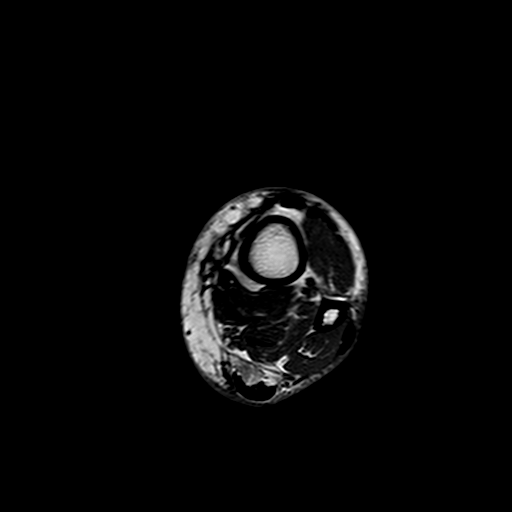

[Series 11: T2 fat-sat · axial · left · 4.5mm · 0.38mm/px · z∈[-95,+40]mm · 8 of 28 slices shown]
[im 1/28]
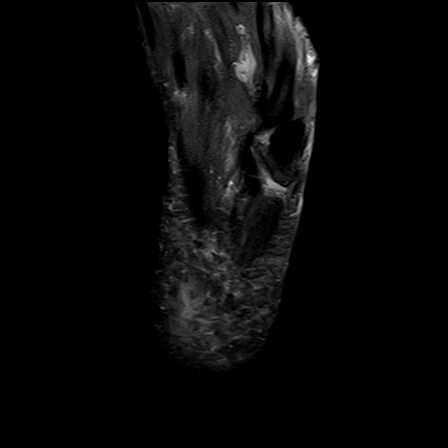
[im 4/28]
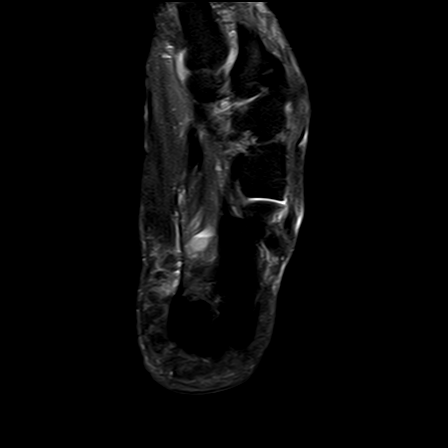
[im 8/28]
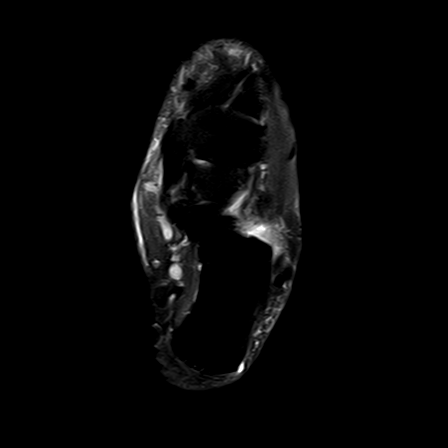
[im 12/28]
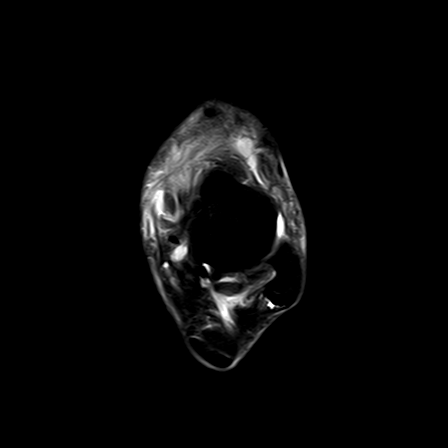
[im 16/28]
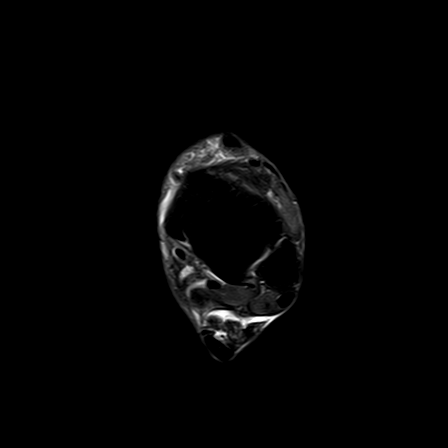
[im 20/28]
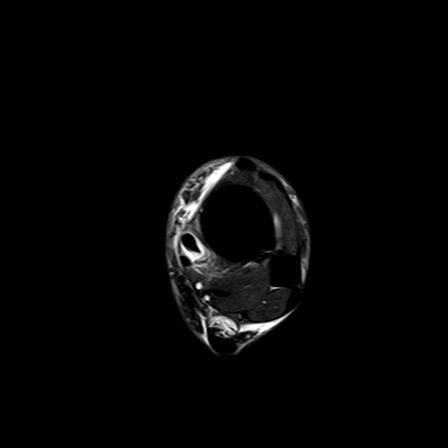
[im 24/28]
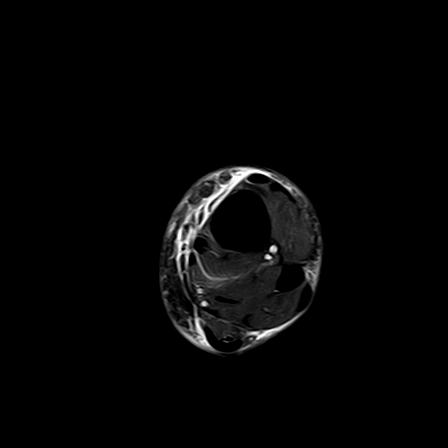
[im 28/28]
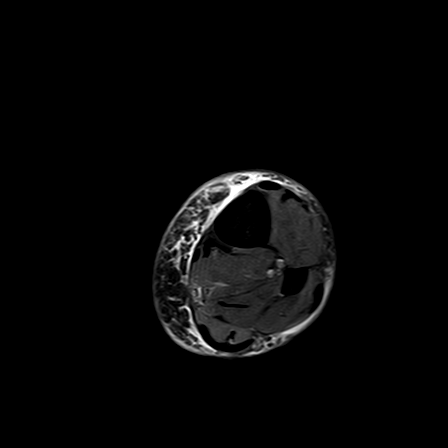

[Series 12: T1 · coronal · left · 4.0mm · 0.35mm/px · 3 of 26 slices shown (3 of 3)]
[im 5/26]
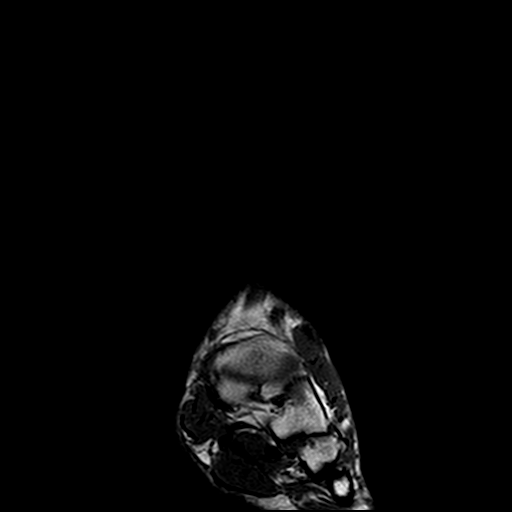
[im 13/26]
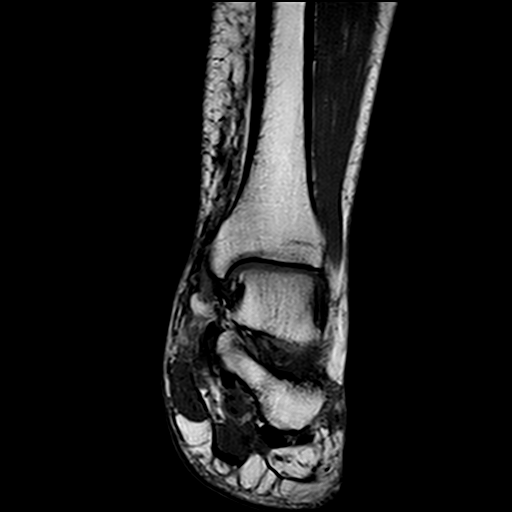
[im 21/26]
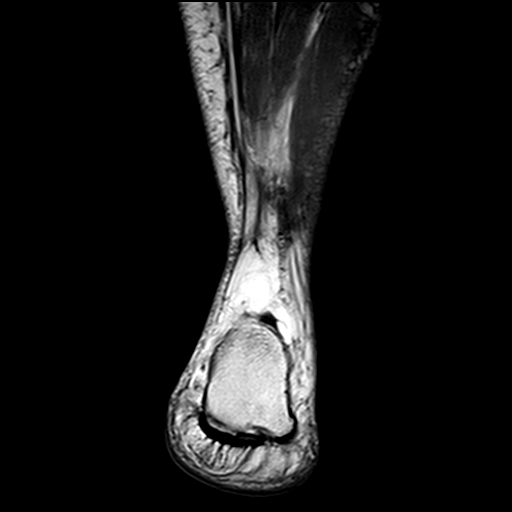

[23 of 40 positions shown; findings below may reference images not displayed]

FINDINGS: No acute bony lesions are seen at the left ankle.  There is old chip fracture of tip of medial malleolus with moderate secondary degenerative arthritis of the talotibial joint.  Medial collateral ligament is intact.

However, there is evidence of tendinopathy involving posterior tibial tendon with tendon sheath inflammation. No evidence of full-thickness disruption of posterior tibial tendon is seen. 

Peroneal tendon is intact.  Achilles tendon and plantar fascia are intact.  Subcutaneous edema over the anterior medial aspect of the ankle is noted.
IMPRESSION: 1. No acute bony lesions. Old chip fracture of tip of medial malleolus with secondary degenerative changes of the ankle joint. Medial collateral ligament is intact.

2. Significant tendinopathy of posterior tibial tendon is noted with tendon sheath inflammation and increased T2 signal suggestive of partial-thickness injury to the posterior tibial tendon.  No evidence of full-thickness disruption is seen.

3. Subcutaneous edema over the anterior medial aspect of the ankle joint.

## 2023-08-12 ENCOUNTER — Ambulatory Visit (INDEPENDENT_AMBULATORY_CARE_PROVIDER_SITE_OTHER): Payer: Self-pay

## 2023-08-19 ENCOUNTER — Other Ambulatory Visit: Payer: Self-pay

## 2023-08-19 ENCOUNTER — Ambulatory Visit (INDEPENDENT_AMBULATORY_CARE_PROVIDER_SITE_OTHER): Payer: 59

## 2023-08-19 ENCOUNTER — Encounter (INDEPENDENT_AMBULATORY_CARE_PROVIDER_SITE_OTHER): Payer: Self-pay

## 2023-08-19 VITALS — BP 124/90 | HR 95 | Temp 97.7°F | Resp 18 | Ht 64.0 in | Wt 225.2 lb

## 2023-08-19 DIAGNOSIS — R0600 Dyspnea, unspecified: Secondary | ICD-10-CM | POA: Insufficient documentation

## 2023-08-19 DIAGNOSIS — R0982 Postnasal drip: Secondary | ICD-10-CM

## 2023-08-19 DIAGNOSIS — R519 Headache, unspecified: Secondary | ICD-10-CM

## 2023-08-19 DIAGNOSIS — R058 Other specified cough: Secondary | ICD-10-CM

## 2023-08-19 DIAGNOSIS — J029 Acute pharyngitis, unspecified: Secondary | ICD-10-CM

## 2023-08-19 MED ORDER — DEXAMETHASONE SODIUM PHOSPHATE 4 MG/ML INJECTION SOLUTION
4.0000 mg | INTRAMUSCULAR | Status: AC
Start: 2023-08-19 — End: 2023-08-19
  Administered 2023-08-19: 4 mg via INTRAMUSCULAR

## 2023-08-19 MED ORDER — AMOXICILLIN 875 MG-POTASSIUM CLAVULANATE 125 MG TABLET
1.0000 | ORAL_TABLET | Freq: Two times a day (BID) | ORAL | 0 refills | Status: AC
Start: 2023-08-19 — End: 2023-08-26

## 2023-08-19 MED ORDER — METHYLPREDNISOLONE 4 MG TABLETS IN A DOSE PACK
ORAL_TABLET | ORAL | 0 refills | Status: DC
Start: 2023-08-19 — End: 2023-09-30

## 2023-08-19 MED ORDER — PROMETHAZINE-DM 6.25 MG-15 MG/5 ML ORAL SYRUP
5.0000 mL | ORAL_SOLUTION | Freq: Four times a day (QID) | ORAL | 0 refills | Status: DC | PRN
Start: 2023-08-19 — End: 2023-09-30

## 2023-08-19 MED ORDER — LOSARTAN 50 MG TABLET
50.0000 mg | ORAL_TABLET | Freq: Two times a day (BID) | ORAL | 0 refills | Status: DC
Start: 2023-08-19 — End: 2023-09-30

## 2023-08-19 MED ORDER — AZITHROMYCIN 250 MG TABLET
ORAL_TABLET | ORAL | 0 refills | Status: DC
Start: 2023-08-19 — End: 2023-09-30

## 2023-08-19 NOTE — Nursing Note (Signed)
08/19/23 1418   Recent Weight Change   Have you had a recent unexplained weight loss or gain? N   Health Education and Literacy   How often do you have a problem understanding what is told to you about your medical condition?  Never   Domestic Violence   Because we are aware of abuse and domestic violence today, we ask all patients: Are you being hurt, hit, or frightened by anyone at your home or in your life?  N   Basic Needs   Do you have any basic needs within your home that are not being met? (such as Food, Shelter, [employer] fast streamer, Tranportation, paying for bills and/or medications) N   Advanced Directives   Do you have any advanced directives? No Advance   Would you like an advanced directive packet? Refused Packet

## 2023-08-19 NOTE — Progress Notes (Signed)
[facility]  [address]  [place] [address]      Heather Atkins  1956-09-13  [id number]    Date of Service: 08/19/2023  2:15 PM EDT    Chief complaint:   Chief Complaint   Patient presents with    High Blood Pressure     Subjective:     This is a case of a 67 y.o. female who presents today for complaint(s) of: Cough  Patient complains of ear pain (left), headache, nasal congestion, and nonproductive cough. Symptoms began 2 weeks ago. Patient does not have new pets. Patient does not have a history of asthma. Patient does have a history of environmental allergens. Patient does not have a history of smoking. She has been treated ineffectively with OTC Mucinex and prescription Doxycycline that she received from [facility].     History:  Active Ambulatory Problems     Diagnosis Date Noted    Dyspnea 08/19/2023     Resolved Ambulatory Problems     Diagnosis Date Noted    No Resolved Ambulatory Problems     Past Medical History:   Diagnosis Date    Asthma     COPD (chronic obstructive pulmonary disease) (CMS HCC)     Diabetes mellitus (CMS HCC)     Esophageal reflux     Essential hypertension     Migraine      Current Outpatient Medications   Medication Sig    amitriptyline (ELAVIL) 50 mg Oral Tablet Take 1 Tablet (50 mg total) by mouth Every night    amoxicillin-pot clavulanate (AUGMENTIN) 875-125 mg Oral Tablet Take 1 Tablet by mouth Twice daily for 7 days    atorvastatin (LIPITOR) 20 mg Oral Tablet Take 1 Tablet (20 mg total) by mouth Once a day    azithromycin (ZITHROMAX) 250 mg Oral Tablet Take 500 mg (2 tab) on day 1; take 250 mg (1 tab) on days 2-5.    Butalbital-Acetaminophen-Caff 50-300-40 mg Oral Capsule TAKE 1 CAPSULE BY MOUTH TWICE A DAY AS NEEDED    desloratadine (CLARINEX) 5 mg Oral Tablet TAKE 1 TABLET BY MOUTH EVERY DAY    doxycycline hyclate (VIBRAMYCIN) 100 mg Oral Capsule Take 1 Capsule (100 mg total) by mouth Twice daily    fluticasone propionate (FLONASE ALLERGY  RELIEF) 50 mcg/actuation Nasal Spray, Suspension Administer 2 Sprays into each nostril Twice daily    ipratropium bromide (ATROVENT) 21 mcg (0.03 %) Nasal nasal spray ADMINISTER 2 SPRAYS INTO AFFECTED NOSTRIL(S) TWICE PER DAY AS NEEDED    losartan (COZAAR) 50 mg Oral Tablet Take 1 Tablet (50 mg total) by mouth Twice daily for 90 days    Methylprednisolone (MEDROL DOSEPACK) 4 mg Oral Tablets, Dose Pack Take as instructed.    montelukast (SINGULAIR) 10 mg Oral Tablet Take 1 Tablet (10 mg total) by mouth Once a day    omeprazole (PRILOSEC) 40 mg Oral Capsule, Delayed Release(E.C.) TAKE 1 CAPSULE BY MOUTH EVERY DAY BEFORE DINNER    promethazine-dextromethorphan (PHENERGAN-DM) 6.25-15 mg/5 mL Oral Syrup Take 5 mL by mouth Four times a day as needed for Cough     Review of Systems:  Any pertinent Review of Systems as addressed in the HPI above.    Objective   BP (!) 124/90   Pulse 95   Temp 36.5 C (97.7 F) (Temporal)   Resp 18   Ht 1.626 m (5\' 4" )   Wt 102 kg (225 lb 3.2 oz)   SpO2 93%  BMI 38.66 kg/m     BMI addressed: Advised on diet, weight loss, and exercise to reduce above normal BMI.     Physical Exam  Vitals reviewed.   Constitutional:       General: She is not in acute distress.     Appearance: Normal appearance.   Cardiovascular:      Rate and Rhythm: Normal rate and regular rhythm.      Pulses: Normal pulses.      Heart sounds: Normal heart sounds.   Pulmonary:      Effort: Pulmonary effort is normal. Tachypnea present. No respiratory distress.      Breath sounds: Examination of the right-lower field reveals rhonchi. Rhonchi present.   Abdominal:      General: Bowel sounds are normal.      Palpations: Abdomen is soft.   Skin:     General: Skin is warm and dry.      Capillary Refill: Capillary refill takes less than 2 seconds.   Neurological:      General: No focal deficit present.      Mental Status: She is alert and oriented to person, place, and time. Mental status is at baseline.   Psychiatric:          Attention and Perception: Attention normal.         Mood and Affect: Mood and affect normal.         Speech: Speech normal.         Behavior: Behavior normal. Behavior is cooperative.         Thought Content: Thought content normal.        Assessment & Plan  Nonproductive cough  Physical examination reveals rhonchi in the right lower lobe. Patient was prescribed Decadron, Augmentin, Azithromycin, Promethazine-DM and Methylprednisolone after verbalizing understanding and agreement with proposed treatment plan. She was instructed to follow-up with the emergency department if she develops dyspnea at rest.    Orders Placed This Encounter    losartan (COZAAR) 50 mg Oral Tablet    dexAMETHasone 4 mg/mL injection    amoxicillin-pot clavulanate (AUGMENTIN) 875-125 mg Oral Tablet    azithromycin (ZITHROMAX) 250 mg Oral Tablet    Methylprednisolone (MEDROL DOSEPACK) 4 mg Oral Tablets, Dose Pack    promethazine-dextromethorphan (PHENERGAN-DM) 6.25-15 mg/5 mL Oral Syrup     The patient was given the opportunity to ask questions and those questions were answered to the patient's satisfaction. The patient was encouraged to call with any additional questions or concerns. I instructed the patient to follow-up if symptoms persist or worsen. Medication safety was discussed. A good faith effort was made to reconcile the patient's medications. More than 50% of the visit was spent counseling and coordinating care.    Follow up: Return if symptoms worsen or fail to improve.  Ignacia Marvel, APRN,NP-C

## 2023-08-21 ENCOUNTER — Encounter (INDEPENDENT_AMBULATORY_CARE_PROVIDER_SITE_OTHER): Payer: Self-pay

## 2023-08-21 ENCOUNTER — Other Ambulatory Visit: Payer: Self-pay

## 2023-08-21 NOTE — Nursing Note (Signed)
Patient called on 08/20/23 saying she was still having a headache and wanted to know if she should continue to take her Flonase and mucinex that was given to her at [facility]. I LVM for her on 08/21/23 letting her know that the provider said it was fine to keep taking these meds and they should help her symptoms. I advised her to call back with any other questions.    [place], [place]  08/21/2023 08:24

## 2023-09-28 ENCOUNTER — Other Ambulatory Visit (INDEPENDENT_AMBULATORY_CARE_PROVIDER_SITE_OTHER): Payer: Self-pay | Admitting: NURSE PRACTITIONER

## 2023-09-30 ENCOUNTER — Encounter (INDEPENDENT_AMBULATORY_CARE_PROVIDER_SITE_OTHER): Payer: Self-pay

## 2023-09-30 ENCOUNTER — Ambulatory Visit (INDEPENDENT_AMBULATORY_CARE_PROVIDER_SITE_OTHER): Payer: 59

## 2023-09-30 ENCOUNTER — Other Ambulatory Visit: Payer: 59

## 2023-09-30 ENCOUNTER — Other Ambulatory Visit: Payer: Self-pay

## 2023-09-30 VITALS — BP 144/95 | HR 91 | Temp 97.3°F | Resp 18 | Ht 64.0 in | Wt 226.0 lb

## 2023-09-30 DIAGNOSIS — Z114 Encounter for screening for human immunodeficiency virus [HIV]: Secondary | ICD-10-CM | POA: Insufficient documentation

## 2023-09-30 DIAGNOSIS — J309 Allergic rhinitis, unspecified: Secondary | ICD-10-CM

## 2023-09-30 DIAGNOSIS — Z1159 Encounter for screening for other viral diseases: Secondary | ICD-10-CM | POA: Insufficient documentation

## 2023-09-30 DIAGNOSIS — I10 Essential (primary) hypertension: Secondary | ICD-10-CM

## 2023-09-30 DIAGNOSIS — J449 Chronic obstructive pulmonary disease, unspecified: Secondary | ICD-10-CM

## 2023-09-30 DIAGNOSIS — Z6835 Body mass index (BMI) 35.0-35.9, adult: Secondary | ICD-10-CM

## 2023-09-30 DIAGNOSIS — Z6841 Body Mass Index (BMI) 40.0 and over, adult: Secondary | ICD-10-CM | POA: Insufficient documentation

## 2023-09-30 DIAGNOSIS — Z0000 Encounter for general adult medical examination without abnormal findings: Secondary | ICD-10-CM | POA: Insufficient documentation

## 2023-09-30 DIAGNOSIS — G43909 Migraine, unspecified, not intractable, without status migrainosus: Secondary | ICD-10-CM

## 2023-09-30 DIAGNOSIS — J45909 Unspecified asthma, uncomplicated: Secondary | ICD-10-CM | POA: Insufficient documentation

## 2023-09-30 DIAGNOSIS — E785 Hyperlipidemia, unspecified: Secondary | ICD-10-CM

## 2023-09-30 DIAGNOSIS — K219 Gastro-esophageal reflux disease without esophagitis: Secondary | ICD-10-CM

## 2023-09-30 LAB — LIPID PANEL
CHOL/HDL RATIO: 2
CHOLESTEROL: 153 mg/dL (ref <200)
HDL CHOL: 76 mg/dL (ref >=40)
LDL CALC: 51 mg/dL (ref 0–100)
TRIGLYCERIDES: 130 mg/dL (ref <=150)
VLDL CALC: 26 mg/dL (ref 0–50)

## 2023-09-30 LAB — CBC WITH DIFF
BASOPHIL #: 0.1 10*3/uL (ref 0.00–0.10)
BASOPHIL %: 1 % (ref 0–1)
EOSINOPHIL #: 0.1 10*3/uL (ref 0.00–0.50)
EOSINOPHIL %: 2 % (ref 1–7)
HCT: 43.9 % — ABNORMAL HIGH (ref 31.2–41.9)
HGB: 14.6 g/dL — ABNORMAL HIGH (ref 10.9–14.3)
LYMPHOCYTE #: 1.8 10*3/uL (ref 1.00–3.00)
LYMPHOCYTE %: 31 % (ref 16–44)
MCH: 31.8 pg (ref 24.7–32.8)
MCHC: 33.3 g/dL (ref 32.3–35.6)
MCV: 95.4 fL — ABNORMAL HIGH (ref 75.5–95.3)
MONOCYTE #: 0.4 10*3/uL (ref 0.30–1.00)
MONOCYTE %: 6 % (ref 5–13)
MPV: 8.8 fL (ref 7.9–10.8)
NEUTROPHIL #: 3.4 10*3/uL (ref 1.85–7.80)
NEUTROPHIL %: 59 % (ref 43–77)
PLATELETS: 241 10*3/uL (ref 140–440)
RBC: 4.6 10*6/uL (ref 3.63–4.92)
RDW: 13.3 % (ref 12.3–17.7)
WBC: 5.8 10*3/uL (ref 3.8–11.8)

## 2023-09-30 LAB — HIV 1 AND 2 RAPID SCREEN
HIV-1/2 ANTIBODY SCREEN: NONREACTIVE
HIV1-p24 ANTIGEN SCREEN: NONREACTIVE

## 2023-09-30 LAB — HEPATIC FUNCTION PANEL
ALBUMIN/GLOBULIN RATIO: 1.9 — ABNORMAL HIGH (ref 0.8–1.4)
ALBUMIN: 4.7 g/dL (ref 3.5–5.7)
ALKALINE PHOSPHATASE: 76 U/L (ref 34–104)
ALT (SGPT): 16 U/L (ref 7–52)
AST (SGOT): 14 U/L (ref 13–39)
BILIRUBIN DIRECT: 0.1 md/dL (ref 0.03–0.18)
BILIRUBIN TOTAL: 0.5 mg/dL (ref 0.3–1.0)
BILIRUBIN, INDIRECT: 0.4 mg/dL (ref <=1)
GLOBULIN: 2.5 — ABNORMAL LOW (ref 2.9–5.4)
PROTEIN TOTAL: 7.2 g/dL (ref 6.4–8.9)

## 2023-09-30 LAB — BASIC METABOLIC PANEL
ANION GAP: 6 mmol/L (ref 4–13)
BUN/CREA RATIO: 16 (ref 6–22)
BUN: 16 mg/dL (ref 7–25)
CALCIUM: 9.6 mg/dL (ref 8.6–10.3)
CHLORIDE: 105 mmol/L (ref 98–107)
CO2 TOTAL: 29 mmol/L (ref 21–31)
CREATININE: 1 mg/dL (ref 0.60–1.30)
ESTIMATED GFR: 63 mL/min/{1.73_m2} (ref >59)
GLUCOSE: 136 mg/dL — ABNORMAL HIGH (ref 74–109)
OSMOLALITY, CALCULATED: 283 mosm/kg (ref 270–290)
POTASSIUM: 4.8 mmol/L (ref 3.5–5.1)
SODIUM: 140 mmol/L (ref 136–145)

## 2023-09-30 LAB — THYROID STIMULATING HORMONE WITH FREE T4 REFLEX: TSH: 2.209 u[IU]/mL (ref 0.450–5.330)

## 2023-09-30 MED ORDER — AMITRIPTYLINE 50 MG TABLET
50.0000 mg | ORAL_TABLET | Freq: Every evening | ORAL | 1 refills | Status: DC
Start: 2023-09-30 — End: 2024-04-04

## 2023-09-30 MED ORDER — LOSARTAN 50 MG TABLET
50.0000 mg | ORAL_TABLET | Freq: Two times a day (BID) | ORAL | 0 refills | Status: DC
Start: 2023-09-30 — End: 2024-02-01

## 2023-09-30 MED ORDER — BECLOMETHASONE DIPROP 80 MCG/ACTUATION HFA BREATH ACTIVATED AEROSOL
1.0000 | INHALATION_SPRAY | Freq: Two times a day (BID) | RESPIRATORY_TRACT | 2 refills | Status: DC
Start: 2023-09-30 — End: 2024-04-04

## 2023-09-30 MED ORDER — DEXAMETHASONE SODIUM PHOSPHATE 4 MG/ML INJECTION SOLUTION
4.0000 mg | INTRAMUSCULAR | Status: AC
Start: 2023-09-30 — End: 2023-09-30
  Administered 2023-09-30: 4 mg via INTRAMUSCULAR

## 2023-09-30 MED ORDER — MONTELUKAST 10 MG TABLET
10.0000 mg | ORAL_TABLET | Freq: Every day | ORAL | 1 refills | Status: DC
Start: 2023-09-30 — End: 2024-04-04

## 2023-09-30 MED ORDER — DESLORATADINE 5 MG TABLET
5.0000 mg | ORAL_TABLET | Freq: Every day | ORAL | 1 refills | Status: DC
Start: 2023-09-30 — End: 2024-03-25

## 2023-09-30 MED ORDER — ATORVASTATIN 20 MG TABLET
20.0000 mg | ORAL_TABLET | Freq: Every day | ORAL | 1 refills | Status: DC
Start: 2023-09-30 — End: 2024-04-04

## 2023-09-30 MED ORDER — OMEPRAZOLE 40 MG CAPSULE,DELAYED RELEASE
40.0000 mg | DELAYED_RELEASE_CAPSULE | Freq: Every day | ORAL | 1 refills | Status: DC | PRN
Start: 2023-09-30 — End: 2024-04-04

## 2023-09-30 NOTE — Assessment & Plan Note (Signed)
Prilosec 40 mg daily. Patient endorses effectiveness and denies breakthrough symptoms.

## 2023-09-30 NOTE — Nursing Note (Signed)
09/30/23 0810   Depression Screen   Little interest or pleasure in doing things. 0   Feeling down, depressed, or hopeless 0   PHQ 2 Total 0

## 2023-09-30 NOTE — Assessment & Plan Note (Addendum)
Lipitor 20 mg daily. No lipid panel results available to review. Lipid panel ordered.

## 2023-09-30 NOTE — Nursing Note (Signed)
09/30/23 0810   Fall Risk Assessment   Do you feel unsteady when standing or walking? No   Do you worry about falling? No   Have you fallen in the past year? No

## 2023-09-30 NOTE — Assessment & Plan Note (Signed)
Elavil 50 mg QHS, Fioricet Prn for breakthrough. Patient endorses rare, but effective use.

## 2023-09-30 NOTE — Assessment & Plan Note (Addendum)
Singulair 10 mg and Clarinex 5 mg daily. Patient endorses effectiveness, but states that she has issues with recurring sinus issues.

## 2023-09-30 NOTE — Progress Notes (Signed)
[facility]  FAMILY MEDICINE, MEDICAL OFFICE BUILDING  [address]  [place] [address]  [phone]   [name]  09-05-1956  [id number]    Date of Service: 09/30/2023  Chief complaint:   Chief Complaint   Patient presents with    New Patient     Subjective:     Heather Atkins is a 67 y.o. female and presents today as a new patient to establish care, baseline lab values and medication refills.   Her last PCP is leaving the area. She endorses ongoing care with Dr. Sherilyn Banker (Allergist/Immunologist), Dr. Allena Katz (Gastroenterology), and Elnora Morrison (ENT). She has acute complaint of sinus headache which has responded effectively to steroids in the past.     Past Medical History:   Diagnosis Date    Allergic rhinitis     Asthma     COPD (chronic obstructive pulmonary disease) (CMS HCC)     Esophageal reflux     Essential hypertension     Migraine      Past Surgical History:   Procedure Laterality Date    HX TONSILLECTOMY      SINUS SURGERY       Current Outpatient Medications   Medication Sig Dispense Refill    amitriptyline (ELAVIL) 50 mg Oral Tablet Take 1 Tablet (50 mg total) by mouth Every night 90 Tablet 1    atorvastatin (LIPITOR) 20 mg Oral Tablet Take 1 Tablet (20 mg total) by mouth Once a day 90 Tablet 1    beclomethasone dipropionate (QVAR REDIHALER) 80 mcg/actuation Inhalation oral inhaler Take 1 Puff by inhalation Twice daily 10.6 g 2    Butalbital-Acetaminophen-Caff 50-300-40 mg Oral Capsule Take 1 Capsule by mouth Twice per day as needed for Other      desloratadine (CLARINEX) 5 mg Oral Tablet Take 1 Tablet (5 mg total) by mouth Once a day 90 Tablet 1    fluticasone propionate (FLONASE ALLERGY RELIEF) 50 mcg/actuation Nasal Spray, Suspension Administer 2 Sprays into each nostril Twice daily      ipratropium bromide (ATROVENT) 21 mcg (0.03 %) Nasal nasal spray ADMINISTER 2 SPRAYS INTO AFFECTED NOSTRIL(S) TWICE PER DAY AS NEEDED 30 mL 6    losartan (COZAAR) 50 mg  Oral Tablet Take 1 Tablet (50 mg total) by mouth Twice daily for 90 days 180 Tablet 0    montelukast (SINGULAIR) 10 mg Oral Tablet Take 1 Tablet (10 mg total) by mouth Once a day 90 Tablet 1    omeprazole (PRILOSEC) 40 mg Oral Capsule, Delayed Release(E.C.) Take 1 Capsule (40 mg total) by mouth Once per day as needed for Other 90 Capsule 1     No current facility-administered medications for this visit.     Allergies   Allergen Reactions    Cat Dander     Cockroach     Dog Dander     Grass Pollen     Mold     Tetanus Vaccines And Toxoid     Tree And Shrub Pollen      Review of Systems  Any pertinent Review of Systems as addressed in the HPI above.    Objective:     BP (!) 144/95   Pulse 91   Temp 36.3 C (97.3 F) (Temporal)   Resp 18   Ht 1.626 m (5\' 4" )   Wt 103 kg (226 lb)   SpO2 92%   BMI 38.79 kg/m     BMI addressed: Advised on diet, weight loss,  and exercise to reduce above normal BMI.     Physical Exam  Vitals reviewed.   Constitutional:       General: She is not in acute distress.     Appearance: Normal appearance. She is obese.   HENT:      Head: Normocephalic.      Nose: Nose normal.      Right Turbinates: Swollen.      Left Turbinates: Swollen.      Mouth/Throat:      Mouth: Mucous membranes are moist.   Eyes:      Pupils: Pupils are equal, round, and reactive to light.   Cardiovascular:      Rate and Rhythm: Normal rate and regular rhythm.      Pulses: Normal pulses.      Heart sounds: Normal heart sounds.   Pulmonary:      Effort: Pulmonary effort is normal.      Breath sounds: Normal breath sounds.   Abdominal:      General: Bowel sounds are normal.      Palpations: Abdomen is soft.   Musculoskeletal:         General: Normal range of motion.      Cervical back: Normal range of motion.   Skin:     General: Skin is warm and dry.      Capillary Refill: Capillary refill takes less than 2 seconds.   Neurological:      General: No focal deficit present.      Mental Status: She is alert and oriented  to person, place, and time. Mental status is at baseline.   Psychiatric:         Attention and Perception: Attention normal.         Mood and Affect: Mood and affect normal.         Speech: Speech normal.         Behavior: Behavior normal. Behavior is cooperative.         Thought Content: Thought content normal.       Laboratory:  A1C: 5.4  A1C Date: 07/09/2022    COMPLETE BLOOD COUNT   Lab Results   Component Value Date    WBC 6.1 06/24/2023    HGB 14.5 (H) 06/24/2023    HCT 43.1 (H) 06/24/2023    PLTCNT 227 06/24/2023     DIFFERENTIAL  Lab Results   Component Value Date    PMNS 56 06/24/2023    LYMPHOCYTES 34 06/24/2023    MONOCYTES 7 06/24/2023    EOSINOPHIL 2 06/24/2023    BASOPHILS 1 06/24/2023    BASOPHILS 0.10 06/24/2023    PMNABS 3.40 06/24/2023    LYMPHSABS 2.10 06/24/2023    EOSABS 0.10 06/24/2023    MONOSABS 0.40 06/24/2023     BASIC METABOLIC PANEL  Lab Results   Component Value Date    SODIUM 139 07/09/2022    POTASSIUM 4.2 07/09/2022    CHLORIDE 105 07/09/2022    CO2 25 07/09/2022    ANIONGAP 9 (L) 07/09/2022    BUN 19 07/09/2022    CREATININE 0.98 07/09/2022    BUNCRRATIO 19 07/09/2022    GFR 64 07/09/2022    CALCIUM 9.7 07/09/2022    GLUCOSENF 91 07/09/2022     LIVER TESTS  Lab Results   Component Value Date    ALBUMIN 4.4 07/09/2022    AST 18 07/09/2022    ALT 25 07/09/2022    ALKPHOS 69 07/09/2022    TOTBILIRUBIN 0.3  07/09/2022    BILIRUBINCON 0.06 07/09/2022     THYROID STIMULATING HORMONE  Lab Results   Component Value Date    TSH 2.539 07/09/2022     Most recent lab results reviewed with patient.  Assessment & Plan  Severe obesity (BMI 35.0-35.9 with comorbidity) (CMS HCC)  Wt Readings from Last 3 Encounters:   09/30/23 103 kg (226 lb)   08/19/23 102 kg (225 lb 3.2 oz)   08/26/22 81.6 kg (180 lb)   Patient states that she had gained weight after breaking her ankle, but that she had recently been exercising regularly.  Chronic obstructive pulmonary disease, unspecified COPD type (CMS HCC)  Qvar  inhaler BID. Denies breakthrough symptoms.  Essential hypertension  BP Readings from Last 3 Encounters:   09/30/23 (!) 144/95   08/19/23 (!) 124/90   Losartan 50 mg BID. BP elevated today. Patient verbalizes that it is due to her sinus headache and that it runs WNL at home.  Migraine  Elavil 50 mg QHS, Fioricet Prn for breakthrough. Patient endorses rare, but effective use.  Gastroesophageal reflux disease, unspecified whether esophagitis present  Prilosec 40 mg daily. Patient endorses effectiveness and denies breakthrough symptoms.   Allergic rhinitis  Singulair 10 mg and Clarinex 5 mg daily. Patient endorses effectiveness, but states that she has issues with recurring sinus issues.  Hyperlipidemia, unspecified hyperlipidemia type  Lipitor 20 mg daily. No lipid panel results available to review. Lipid panel ordered.    I reviewed the patient's past medical, surgical, social and family histories. Immunizations and allergies were reviewed and discussed. Counseling with regards to preventative care given. Medication summary was discussed with the patient to verify compliance and understanding. Medication safety was discussed. A good faith effort was made to reconcile the patient's medications. Refill orders were placed on all active medications except Fioricet which she states that she still had a supply due to infrequent use. Lab orders were placed for BMP, CBC, Hepatic function panel, Lipid panel and TSH to establish a baseline in a new patient. Patient was agreeable to complete the Hepatitis C and HIV screenings. The patient was given the opportunity to ask questions and those questions were answered to the patient's satisfaction. The patient was encouraged to call with any additional questions or concerns. Instructed patient to call back with onset of new symptoms or if current symptoms persist or worsen. More than 50% of the visit was spent counseling and coordinating care.    Plan:  Orders Placed This Encounter     BASIC METABOLIC PANEL    CBC/DIFF    HEPATIC FUNCTION PANEL    LIPID PANEL    THYROID STIMULATING HORMONE WITH FREE T4 REFLEX    HIV 1 AND 2 RAPID SCREEN    HEPATITIS C ANTIBODY SCREEN WITH REFLEX TO HCV PCR    amitriptyline (ELAVIL) 50 mg Oral Tablet    atorvastatin (LIPITOR) 20 mg Oral Tablet    losartan (COZAAR) 50 mg Oral Tablet    montelukast (SINGULAIR) 10 mg Oral Tablet    beclomethasone dipropionate (QVAR REDIHALER) 80 mcg/actuation Inhalation oral inhaler    omeprazole (PRILOSEC) 40 mg Oral Capsule, Delayed Release(E.C.)    desloratadine (CLARINEX) 5 mg Oral Tablet     Depression screening is negative. PHQ 2 Total: 0     Return in about 6 months (around 03/30/2024).  Ignacia Marvel, APRN,NP-C 09/30/2023, 08:44

## 2023-09-30 NOTE — Assessment & Plan Note (Addendum)
BP Readings from Last 3 Encounters:   09/30/23 (!) 144/95   08/19/23 (!) 124/90   Losartan 50 mg BID. BP elevated today. Patient verbalizes that it is due to her sinus headache and that it runs WNL at home.

## 2023-09-30 NOTE — Assessment & Plan Note (Signed)
Qvar inhaler BID. Denies breakthrough symptoms.

## 2023-09-30 NOTE — Assessment & Plan Note (Signed)
Wt Readings from Last 3 Encounters:   09/30/23 103 kg (226 lb)   08/19/23 102 kg (225 lb 3.2 oz)   08/26/22 81.6 kg (180 lb)   Patient states that she had gained weight after breaking her ankle, but that she had recently been exercising regularly.

## 2023-09-30 NOTE — Nursing Note (Signed)
09/30/23 0810   Domestic Violence   Because we are aware of abuse and domestic violence today, we ask all patients: Are you being hurt, hit, or frightened by anyone at your home or in your life?  N   Basic Needs   Do you have any basic needs within your home that are not being met? (such as Food, Shelter, [employer] fast streamer, Tranportation, paying for bills and/or medications) N

## 2023-10-01 LAB — HEPATITIS C ANTIBODY SCREEN WITH REFLEX TO HCV PCR: HCV ANTIBODY QUALITATIVE: NEGATIVE

## 2023-10-07 ENCOUNTER — Ambulatory Visit (INDEPENDENT_AMBULATORY_CARE_PROVIDER_SITE_OTHER): Payer: 59

## 2023-10-07 ENCOUNTER — Other Ambulatory Visit: Payer: 59

## 2023-10-07 ENCOUNTER — Other Ambulatory Visit: Payer: Self-pay

## 2023-10-07 ENCOUNTER — Encounter (INDEPENDENT_AMBULATORY_CARE_PROVIDER_SITE_OTHER): Payer: Self-pay

## 2023-10-07 VITALS — BP 139/96 | HR 97 | Temp 97.2°F | Ht 64.0 in | Wt 222.0 lb

## 2023-10-07 DIAGNOSIS — R0989 Other specified symptoms and signs involving the circulatory and respiratory systems: Secondary | ICD-10-CM

## 2023-10-07 DIAGNOSIS — R059 Cough, unspecified: Secondary | ICD-10-CM

## 2023-10-07 DIAGNOSIS — R0981 Nasal congestion: Secondary | ICD-10-CM

## 2023-10-07 DIAGNOSIS — R739 Hyperglycemia, unspecified: Secondary | ICD-10-CM | POA: Insufficient documentation

## 2023-10-07 LAB — POCT RAPID COVID-19 & FLU (AMB ONLY)
COVID-19 AG: NEGATIVE
INFLUENZA TYPE A: NEGATIVE
INFLUENZA TYPE B: NEGATIVE

## 2023-10-07 LAB — HGA1C (HEMOGLOBIN A1C WITH EST AVG GLUCOSE): HEMOGLOBIN A1C: 5.9 % (ref 4.0–6.0)

## 2023-10-07 MED ORDER — BENZONATATE 100 MG CAPSULE
100.0000 mg | ORAL_CAPSULE | Freq: Three times a day (TID) | ORAL | 0 refills | Status: AC
Start: 2023-10-07 — End: 2023-10-14

## 2023-10-07 MED ORDER — PROMETHAZINE-DM 6.25 MG-15 MG/5 ML ORAL SYRUP
5.0000 mL | ORAL_SOLUTION | Freq: Four times a day (QID) | ORAL | 0 refills | Status: DC | PRN
Start: 2023-10-07 — End: 2024-04-04

## 2023-10-07 NOTE — Nursing Note (Signed)
10/07/23 0900   Rapid Flu   Time Performed 0845   Rapid Flu A Result Negative   Rapid Flu B Result Negative   Internal Control Valid yes   Initials HW   Influenza Culture Sent No   POCT Instrument Cameron

## 2023-10-07 NOTE — Progress Notes (Signed)
FAMILY MEDICINE, MEDICAL OFFICE BUILDING  [address]  [place] [address]      TERISA WILLING  Dec 28, 1955  [id number]    Date of Service: 10/07/2023  8:15 AM EDT    Chief complaint:   Chief Complaint   Patient presents with    Congestion    Cough    Sneezing    Runny Nose     Pt also states she has had a low grade temp - all symptoms started on 10/04/23     Subjective:     This is a case of a 67 y.o. female who presents today for complaint(s) of: Allergic Rhinitis  Patient presents for evaluation of allergic symptoms. Symptoms include nasal congestion, sneezing, eye irritation, watery eyes, cough and are present in a seasonal pattern. Treatment in the past has included intranasal steroids: Antrovent, oral antihistamine: Clarinex, leukotriene antagonist: Singulair and is not effective in the patient's opinion.    History:  Active Ambulatory Problems     Diagnosis Date Noted    COPD (chronic obstructive pulmonary disease) (CMS HCC)     Essential hypertension     Migraine     Asthma     Allergic rhinitis     Esophageal reflux     Hyperlipidemia 09/30/2023    Severe obesity (BMI 35.0-35.9 with comorbidity) (CMS HCC) 09/30/2023     Resolved Ambulatory Problems     Diagnosis Date Noted    Dyspnea 08/19/2023     No Additional Past Medical History     Current Outpatient Medications   Medication Sig    amitriptyline (ELAVIL) 50 mg Oral Tablet Take 1 Tablet (50 mg total) by mouth Every night    atorvastatin (LIPITOR) 20 mg Oral Tablet Take 1 Tablet (20 mg total) by mouth Once a day    beclomethasone dipropionate (QVAR REDIHALER) 80 mcg/actuation Inhalation oral inhaler Take 1 Puff by inhalation Twice daily    benzonatate (TESSALON) 100 mg Oral Capsule Take 1 Capsule (100 mg total) by mouth Three times a day for 7 days    Butalbital-Acetaminophen-Caff 50-300-40 mg Oral Capsule Take 1 Capsule by mouth Twice per day as needed for Other    desloratadine (CLARINEX) 5 mg Oral Tablet Take 1 Tablet (5 mg total) by mouth  Once a day    fluticasone propionate (FLONASE ALLERGY RELIEF) 50 mcg/actuation Nasal Spray, Suspension Administer 2 Sprays into each nostril Twice daily    ipratropium bromide (ATROVENT) 21 mcg (0.03 %) Nasal nasal spray ADMINISTER 2 SPRAYS INTO AFFECTED NOSTRIL(S) TWICE PER DAY AS NEEDED    losartan (COZAAR) 50 mg Oral Tablet Take 1 Tablet (50 mg total) by mouth Twice daily for 90 days    montelukast (SINGULAIR) 10 mg Oral Tablet Take 1 Tablet (10 mg total) by mouth Once a day    omeprazole (PRILOSEC) 40 mg Oral Capsule, Delayed Release(E.C.) Take 1 Capsule (40 mg total) by mouth Once per day as needed for Other    promethazine-dextromethorphan (PHENERGAN-DM) 6.25-15 mg/5 mL Oral Syrup Take 5 mL by mouth Four times a day as needed for Cough     Review of Systems:  Any pertinent Review of Systems as addressed in the HPI above.    Objective   BP (!) 139/96   Pulse 97   Temp 36.2 C (97.2 F) (Temporal)   Ht 1.626 m (5\' 4" )   Wt 101 kg (222 lb)   SpO2 95%   BMI 38.11 kg/m      Physical Exam  Vitals reviewed.   Constitutional:       General: She is not in acute distress.     Appearance: Normal appearance. She is obese.   HENT:      Head: Normocephalic.      Nose: Nose normal.      Right Turbinates: Swollen.      Left Turbinates: Swollen.      Mouth/Throat:      Mouth: Mucous membranes are moist.   Eyes:      Pupils: Pupils are equal, round, and reactive to light.   Cardiovascular:      Rate and Rhythm: Normal rate and regular rhythm.      Pulses: Normal pulses.      Heart sounds: Normal heart sounds.   Pulmonary:      Effort: Pulmonary effort is normal.      Breath sounds: Normal breath sounds.   Abdominal:      General: Bowel sounds are normal.      Palpations: Abdomen is soft.   Musculoskeletal:         General: Normal range of motion.      Cervical back: Normal range of motion.   Skin:     General: Skin is warm and dry.      Capillary Refill: Capillary refill takes less than 2 seconds.   Neurological:       General: No focal deficit present.      Mental Status: She is alert and oriented to person, place, and time. Mental status is at baseline.   Psychiatric:         Attention and Perception: Attention normal.         Mood and Affect: Mood and affect normal.         Speech: Speech normal.         Behavior: Behavior normal. Behavior is cooperative.         Thought Content: Thought content normal.   Assessment & Plan  Cough, unspecified type  POCT testing for Covid/Flu was negative. Patient advised of likely allergic etiology. Available treatment options were discussed. Refill of promethazine-DM and Benzonatate ordered. Patient advised to continue with QVAR, Singulair, Clarinex and Atrovent as previously prescribed.   Elevated serum glucose  Lab Results   Component Value Date    GLUCOSENF 136 (H) 09/30/2023   A1C ordered to rule out DM.    The patient was given the opportunity to ask questions and those questions were answered to the patient's satisfaction. The patient was encouraged to call with any additional questions or concerns. I instructed the patient to follow-up if symptoms persist or worsen. Medication safety was discussed. A good faith effort was made to reconcile the patient's medications. More than 50% of the visit was spent counseling and coordinating care.    Orders Placed This Encounter    HGA1C (HEMOGLOBIN A1C WITH EST AVG GLUCOSE)    POCT Rapid Covid & Flu (Sofia)    benzonatate (TESSALON) 100 mg Oral Capsule    promethazine-dextromethorphan (PHENERGAN-DM) 6.25-15 mg/5 mL Oral Syrup     Follow up: Return if symptoms worsen or fail to improve.  Ignacia Marvel, APRN,NP-C

## 2023-10-09 ENCOUNTER — Encounter (INDEPENDENT_AMBULATORY_CARE_PROVIDER_SITE_OTHER): Payer: Self-pay

## 2023-11-24 ENCOUNTER — Encounter (INDEPENDENT_AMBULATORY_CARE_PROVIDER_SITE_OTHER): Payer: Self-pay | Admitting: Family

## 2023-11-24 ENCOUNTER — Other Ambulatory Visit: Payer: 59 | Attending: Family | Admitting: Family

## 2023-11-24 ENCOUNTER — Ambulatory Visit (INDEPENDENT_AMBULATORY_CARE_PROVIDER_SITE_OTHER): Payer: 59 | Admitting: Family

## 2023-11-24 ENCOUNTER — Other Ambulatory Visit: Payer: Self-pay

## 2023-11-24 VITALS — BP 134/89 | HR 88 | Temp 97.8°F | Ht 64.0 in | Wt 229.6 lb

## 2023-11-24 DIAGNOSIS — Z124 Encounter for screening for malignant neoplasm of cervix: Secondary | ICD-10-CM

## 2023-11-24 NOTE — Progress Notes (Signed)
FAMILY MEDICINE, MEDICAL OFFICE BUILDING  [address]  [place] [address]          Name: Heather Atkins MRN:  [id number]   Date: 11/24/2023 Age: 67 y.o.          Provider: Elliot Gurney, FNP-BC    Reason for visit: Needs Pap      History of Present Illness:  Heather Atkins is a 67 y.o. female presenting for pap.   H/o precancerous cells. Treated with cryotherapy/laser therapy. No recent abnormality.   Menopause age 78. No vaginal pain or discharge. No urinary symptoms. Possible yeast infection after antibiotic 3-4 weeks ago.   No breast symptoms. Normal mammogram 04/22/23.     Historical Data    Past Medical History:  Past Medical History:   Diagnosis Date    Allergic rhinitis     Asthma     COPD (chronic obstructive pulmonary disease) (CMS HCC)     Esophageal reflux     Essential hypertension     Migraine          Past Surgical History:  Past Surgical History:   Procedure Laterality Date    HX TONSILLECTOMY      SINUS SURGERY           Allergies:  Allergies   Allergen Reactions    Cat Dander     Cockroach     Dog Dander     Grass Pollen     Mold     Tetanus Vaccines And Toxoid     Tree And Shrub Pollen      Medications:  Current Outpatient Medications   Medication Sig    amitriptyline (ELAVIL) 50 mg Oral Tablet Take 1 Tablet (50 mg total) by mouth Every night    atorvastatin (LIPITOR) 20 mg Oral Tablet Take 1 Tablet (20 mg total) by mouth Once a day    beclomethasone dipropionate (QVAR REDIHALER) 80 mcg/actuation Inhalation oral inhaler Take 1 Puff by inhalation Twice daily    Butalbital-Acetaminophen-Caff 50-300-40 mg Oral Capsule Take 1 Capsule by mouth Twice per day as needed for Other    desloratadine (CLARINEX) 5 mg Oral Tablet Take 1 Tablet (5 mg total) by mouth Once a day    fluticasone propionate (FLONASE ALLERGY RELIEF) 50 mcg/actuation Nasal Spray, Suspension Administer 2 Sprays into each nostril Twice daily    ipratropium bromide (ATROVENT) 21 mcg (0.03 %) Nasal nasal spray ADMINISTER 2  SPRAYS INTO AFFECTED NOSTRIL(S) TWICE PER DAY AS NEEDED    losartan (COZAAR) 50 mg Oral Tablet Take 1 Tablet (50 mg total) by mouth Twice daily for 90 days    montelukast (SINGULAIR) 10 mg Oral Tablet Take 1 Tablet (10 mg total) by mouth Once a day    omeprazole (PRILOSEC) 40 mg Oral Capsule, Delayed Release(E.C.) Take 1 Capsule (40 mg total) by mouth Once per day as needed for Other    promethazine-dextromethorphan (PHENERGAN-DM) 6.25-15 mg/5 mL Oral Syrup Take 5 mL by mouth Four times a day as needed for Cough     Family History:  Family Medical History:       Problem Relation (Age of Onset)    Asthma Other    Breast Cancer Paternal Aunt    Cancer Other    Hearing Loss Other    Migraines Other    Sleep apnea Other    Thyroid Disease Other            Social History:  Social History  Socioeconomic History    Marital status: Married   Tobacco Use    Smoking status: Never    Smokeless tobacco: Never   Substance and Sexual Activity    Alcohol use: Yes     Alcohol/week: 4.0 standard drinks of alcohol     Types: 4 Glasses of wine per week    Drug use: Not Currently             Physical Exam:  Vital Signs:  Vitals:    11/24/23 1601 11/24/23 1603   BP: (!) 148/92 134/89   Pulse: 88    Temp: 36.6 C (97.8 F)    TempSrc: Temporal    SpO2: 94%    Weight: 104 kg (229 lb 9.6 oz)    Height: 1.626 m (5\' 4" )    BMI: 39.41      Physical Exam  Vitals and nursing note reviewed. Exam conducted with a chaperone present ([place], [place]).   Constitutional:       General: She is not in acute distress.     Appearance: Normal appearance. She is obese. She is not ill-appearing.   HENT:      Head: Normocephalic.   Eyes:      General: No scleral icterus.  Pulmonary:      Effort: Pulmonary effort is normal. No accessory muscle usage.   Abdominal:      Hernia: There is no hernia in the left inguinal area or right inguinal area.   Genitourinary:     General: Normal vulva.      Pubic Area: No rash.       Labia:         Right: No rash, tenderness,  lesion or injury.         Left: No rash, tenderness, lesion or injury.       Urethra: No prolapse or urethral lesion.      Vagina: Erythema (mild) present. No vaginal discharge or prolapsed vaginal walls.      Cervix: Normal. No discharge, lesion or eversion.      Adnexa:         Right: No mass or tenderness.          Left: No mass or tenderness.     Lymphadenopathy:      Lower Body: No right inguinal adenopathy. No left inguinal adenopathy.   Skin:     General: Skin is warm and dry.   Neurological:      General: No focal deficit present.      Mental Status: She is alert.   Psychiatric:         Attention and Perception: Attention normal.         Mood and Affect: Mood and affect normal.         Speech: Speech normal.         Behavior: Behavior normal. Behavior is cooperative.          Assessment:    ICD-10-CM    1. Screening for cervical cancer  Z12.4 THIN PREP PAP AND HPV MRNA E6/E7 W/ REFLEX HPV 16,18/45 ([facility])           Plan:  Orders Placed This Encounter    THIN PREP PAP AND HPV MRNA E6/E7 W/ REFLEX HPV 16,18/45 ([facility])       Screening for cervical cancer: Routine pelvic exam with Pap.  No acute symptoms.       Return for as previously advised per [facility].    Elliot Gurney, FNP-BC  Portions of this note may be dictated using voice recognition software or a dictation service. Variances in spelling and vocabulary are possible and unintentional. Not all errors are caught/corrected. Please notify the [facility] Parkin if any discrepancies are noted or if the meaning of any statement is not clear.

## 2023-11-24 NOTE — Nursing Note (Signed)
11/24/23 1602   Domestic Violence   Because we are aware of abuse and domestic violence today, we ask all patients: Are you being hurt, hit, or frightened by anyone at your home or in your life?  N   Basic Needs   Do you have any basic needs within your home that are not being met? (such as Food, Shelter, [employer] fast streamer, Tranportation, paying for bills and/or medications) N

## 2023-11-28 LAB — THIN PREP PAP AND HPV MRNA E6/E7 W/ REFLEX HPV 16,18/45 (QUEST): HPV MRNA E6/E7: NOT DETECTED

## 2023-11-30 ENCOUNTER — Encounter (INDEPENDENT_AMBULATORY_CARE_PROVIDER_SITE_OTHER): Payer: Self-pay

## 2024-01-31 ENCOUNTER — Other Ambulatory Visit (INDEPENDENT_AMBULATORY_CARE_PROVIDER_SITE_OTHER): Payer: Self-pay

## 2024-02-02 ENCOUNTER — Other Ambulatory Visit (INDEPENDENT_AMBULATORY_CARE_PROVIDER_SITE_OTHER): Payer: Self-pay | Admitting: NURSE PRACTITIONER

## 2024-03-25 ENCOUNTER — Other Ambulatory Visit (INDEPENDENT_AMBULATORY_CARE_PROVIDER_SITE_OTHER): Payer: Self-pay

## 2024-03-30 ENCOUNTER — Encounter (INDEPENDENT_AMBULATORY_CARE_PROVIDER_SITE_OTHER): Payer: Self-pay

## 2024-04-04 ENCOUNTER — Encounter (INDEPENDENT_AMBULATORY_CARE_PROVIDER_SITE_OTHER): Payer: Self-pay

## 2024-04-04 ENCOUNTER — Other Ambulatory Visit: Payer: Self-pay

## 2024-04-04 ENCOUNTER — Ambulatory Visit: Attending: Internal Medicine

## 2024-04-04 VITALS — BP 123/88 | HR 88 | Temp 97.1°F | Resp 16 | Ht 64.0 in | Wt 236.8 lb

## 2024-04-04 DIAGNOSIS — R300 Dysuria: Secondary | ICD-10-CM | POA: Insufficient documentation

## 2024-04-04 DIAGNOSIS — Z1231 Encounter for screening mammogram for malignant neoplasm of breast: Secondary | ICD-10-CM | POA: Insufficient documentation

## 2024-04-04 DIAGNOSIS — Z7951 Long term (current) use of inhaled steroids: Secondary | ICD-10-CM | POA: Insufficient documentation

## 2024-04-04 DIAGNOSIS — Z79899 Other long term (current) drug therapy: Secondary | ICD-10-CM | POA: Insufficient documentation

## 2024-04-04 DIAGNOSIS — Z13820 Encounter for screening for osteoporosis: Secondary | ICD-10-CM | POA: Insufficient documentation

## 2024-04-04 DIAGNOSIS — Z6841 Body Mass Index (BMI) 40.0 and over, adult: Secondary | ICD-10-CM | POA: Insufficient documentation

## 2024-04-04 DIAGNOSIS — Z9189 Other specified personal risk factors, not elsewhere classified: Secondary | ICD-10-CM | POA: Insufficient documentation

## 2024-04-04 DIAGNOSIS — J449 Chronic obstructive pulmonary disease, unspecified: Secondary | ICD-10-CM | POA: Insufficient documentation

## 2024-04-04 DIAGNOSIS — R7303 Prediabetes: Secondary | ICD-10-CM | POA: Insufficient documentation

## 2024-04-04 DIAGNOSIS — E785 Hyperlipidemia, unspecified: Secondary | ICD-10-CM | POA: Insufficient documentation

## 2024-04-04 DIAGNOSIS — I10 Essential (primary) hypertension: Secondary | ICD-10-CM | POA: Insufficient documentation

## 2024-04-04 DIAGNOSIS — Z1211 Encounter for screening for malignant neoplasm of colon: Secondary | ICD-10-CM | POA: Insufficient documentation

## 2024-04-04 LAB — BASIC METABOLIC PANEL
ANION GAP: 8 mmol/L (ref 4–13)
BUN/CREA RATIO: 21 (ref 6–22)
BUN: 18 mg/dL (ref 7–25)
CALCIUM: 9.7 mg/dL (ref 8.6–10.3)
CHLORIDE: 108 mmol/L — ABNORMAL HIGH (ref 98–107)
CO2 TOTAL: 24 mmol/L (ref 21–31)
CREATININE: 0.85 mg/dL (ref 0.60–1.30)
ESTIMATED GFR: 76 mL/min/{1.73_m2} (ref >59)
GLUCOSE: 143 mg/dL — ABNORMAL HIGH (ref 74–109)
OSMOLALITY, CALCULATED: 284 mosm/kg (ref 270–290)
POTASSIUM: 4.6 mmol/L (ref 3.5–5.1)
SODIUM: 140 mmol/L (ref 136–145)

## 2024-04-04 LAB — LIPID PANEL
CHOL/HDL RATIO: 2.3
CHOLESTEROL: 163 mg/dL (ref <200)
HDL CHOL: 72 mg/dL (ref >=40)
LDL CALC: 67 mg/dL (ref 0–100)
TRIGLYCERIDES: 120 mg/dL (ref <=150)
VLDL CALC: 24 mg/dL (ref 0–50)

## 2024-04-04 LAB — HEPATIC FUNCTION PANEL
ALBUMIN/GLOBULIN RATIO: 1.9 — ABNORMAL HIGH (ref 0.8–1.4)
ALBUMIN: 4.5 g/dL (ref 3.5–5.7)
ALKALINE PHOSPHATASE: 73 U/L (ref 34–104)
ALT (SGPT): 25 U/L (ref 7–52)
AST (SGOT): 17 U/L (ref 13–39)
BILIRUBIN DIRECT: 0.08 md/dL (ref 0.03–0.18)
BILIRUBIN TOTAL: 0.4 mg/dL (ref 0.3–1.0)
BILIRUBIN, INDIRECT: 0.32 mg/dL (ref <=1)
GLOBULIN: 2.4 (ref 2.0–3.5)
PROTEIN TOTAL: 6.9 g/dL (ref 6.4–8.9)

## 2024-04-04 LAB — CBC WITH DIFF
BASOPHIL #: 0.1 10*3/uL (ref 0.00–0.10)
BASOPHIL %: 1 % (ref 0–1)
EOSINOPHIL #: 0.1 10*3/uL (ref 0.00–0.50)
EOSINOPHIL %: 2 % (ref 1–7)
HCT: 41.8 % (ref 31.2–41.9)
HGB: 14.1 g/dL (ref 10.9–14.3)
LYMPHOCYTE #: 1.9 10*3/uL (ref 1.10–3.10)
LYMPHOCYTE %: 31 % (ref 16–46)
MCH: 32.4 pg (ref 24.7–32.8)
MCHC: 33.8 g/dL (ref 32.3–35.6)
MCV: 95.8 fL — ABNORMAL HIGH (ref 75.5–95.3)
MONOCYTE #: 0.4 10*3/uL (ref 0.20–0.90)
MONOCYTE %: 6 % (ref 4–11)
MPV: 9.2 fL (ref 7.9–10.8)
NEUTROPHIL #: 3.5 10*3/uL (ref 1.90–8.20)
NEUTROPHIL %: 59 % (ref 43–77)
PLATELETS: 203 10*3/uL (ref 140–440)
RBC: 4.36 10*6/uL (ref 3.63–4.92)
RDW: 13.1 % (ref 12.3–17.7)
WBC: 5.9 10*3/uL (ref 3.8–11.8)

## 2024-04-04 LAB — THYROID STIMULATING HORMONE WITH FREE T4 REFLEX: TSH: 2.145 u[IU]/mL (ref 0.450–5.330)

## 2024-04-04 LAB — URINALYSIS, MACROSCOPIC
BILIRUBIN: NEGATIVE mg/dL
BLOOD: NEGATIVE mg/dL
GLUCOSE: NEGATIVE mg/dL
KETONES: NEGATIVE mg/dL
LEUKOCYTES: NEGATIVE WBCs/uL
NITRITE: NEGATIVE
PH: 5.5 (ref 5.0–9.0)
PROTEIN: NEGATIVE mg/dL
SPECIFIC GRAVITY: 1.013 (ref 1.002–1.030)
UROBILINOGEN: NORMAL mg/dL

## 2024-04-04 LAB — URINALYSIS, MICROSCOPIC
SQUAMOUS EPITHELIAL: 2 /HPF (ref <28)
WBCS: 1 /HPF (ref <6)

## 2024-04-04 LAB — HGA1C (HEMOGLOBIN A1C WITH EST AVG GLUCOSE): HEMOGLOBIN A1C: 6.1 % — ABNORMAL HIGH (ref 4.0–6.0)

## 2024-04-04 MED ORDER — FLUCONAZOLE 150 MG TABLET
150.0000 mg | ORAL_TABLET | Freq: Once | ORAL | 0 refills | Status: AC
Start: 2024-04-04 — End: 2024-04-04

## 2024-04-04 MED ORDER — PNEUMOCOCCAL 21-VALENT CONJ VACCINE-DIP CRM (PF) 0.5 ML IM SYRINGE
0.5000 mL | INJECTION | Freq: Once | INTRAMUSCULAR | 0 refills | Status: AC
Start: 2024-04-04 — End: 2024-04-04

## 2024-04-04 MED ORDER — PROMETHAZINE-DM 6.25 MG-15 MG/5 ML ORAL SYRUP
5.0000 mL | ORAL_SOLUTION | Freq: Four times a day (QID) | ORAL | 0 refills | Status: DC | PRN
Start: 2024-04-04 — End: 2024-09-16

## 2024-04-04 MED ORDER — AMITRIPTYLINE 50 MG TABLET
50.0000 mg | ORAL_TABLET | Freq: Every evening | ORAL | 1 refills | Status: DC
Start: 2024-04-04 — End: 2024-09-16

## 2024-04-04 MED ORDER — OMEPRAZOLE 40 MG CAPSULE,DELAYED RELEASE
40.0000 mg | DELAYED_RELEASE_CAPSULE | Freq: Every day | ORAL | 1 refills | Status: DC | PRN
Start: 2024-04-04 — End: 2024-09-16

## 2024-04-04 MED ORDER — MONTELUKAST 10 MG TABLET
10.0000 mg | ORAL_TABLET | Freq: Every day | ORAL | 1 refills | Status: DC
Start: 2024-04-04 — End: 2024-09-16

## 2024-04-04 MED ORDER — ATORVASTATIN 20 MG TABLET
20.0000 mg | ORAL_TABLET | Freq: Every day | ORAL | 1 refills | Status: DC
Start: 2024-04-04 — End: 2024-09-16

## 2024-04-04 MED ORDER — IPRATROPIUM BROMIDE 21 MCG (0.03 %) NASAL SPRAY
2.0000 | Freq: Two times a day (BID) | NASAL | 3 refills | Status: DC | PRN
Start: 2024-04-04 — End: 2024-09-16

## 2024-04-04 MED ORDER — BECLOMETHASONE DIPROP 80 MCG/ACTUATION HFA BREATH ACTIVATED AEROSOL
1.0000 | INHALATION_SPRAY | Freq: Two times a day (BID) | RESPIRATORY_TRACT | 2 refills | Status: DC
Start: 2024-04-04 — End: 2024-09-16

## 2024-04-04 NOTE — Nursing Note (Signed)
 04/04/24 4401   Domestic Violence   Because we are aware of abuse and domestic violence today, we ask all patients: Are you being hurt, hit, or frightened by anyone at your home or in your life?  N   Basic Needs   Do you have any basic needs within your home that are not being met? (such as Food, Shelter, [employer] fast streamer, Tranportation, paying for bills and/or medications) N

## 2024-04-04 NOTE — Progress Notes (Signed)
 PRN MEDICAL OFFICE [facility]  FAMILY MEDICINE, MEDICAL OFFICE BUILDING  [address]  [place] [address]  [phone]   ARZU MCGAUGHEY  August 02, 1956  [id number]    Date of Service: 04/04/2024  Chief complaint:   Chief Complaint   Patient presents with    Follow Up 6 Months     Subjective:   Heather Atkins is a 68 y.o. female and presents today for follow-up on chronic disease management, medication refills and lab work. Patient verbalizes acute complaint of yeast infection. She states that she has a history of yeast infection which resolved with Diflucan . She verbalizes compliance with medication summary.    Past Medical History:   Diagnosis Date    Allergic rhinitis     Asthma     COPD (chronic obstructive pulmonary disease)     Esophageal reflux     Essential hypertension     Migraine      Past Surgical History:   Procedure Laterality Date    HX TONSILLECTOMY      SINUS SURGERY       Current Outpatient Medications   Medication Sig Dispense Refill    amitriptyline  (ELAVIL ) 50 mg Oral Tablet Take 1 Tablet (50 mg total) by mouth Every night 90 Tablet 1    atorvastatin  (LIPITOR) 20 mg Oral Tablet Take 1 Tablet (20 mg total) by mouth Daily 90 Tablet 1    beclomethasone dipropionate (QVAR REDIHALER) 80 mcg/actuation Inhalation oral inhaler Take 1 Puff by inhalation Twice daily 10.6 g 2    Butalbital-Acetaminophen-Caff 50-300-40 mg Oral Capsule Take 1 Capsule by mouth Twice per day as needed for Other      desloratadine  (CLARINEX ) 5 mg Oral Tablet TAKE 1 TABLET BY MOUTH ONCE A DAY 90 Tablet 1    ipratropium bromide  (ATROVENT ) 21 mcg (0.03 %) Nasal nasal spray Administer 2 Sprays into affected nostril(s) Twice per day as needed 30 mL 3    losartan  (COZAAR ) 50 mg Oral Tablet TAKE 1 TABLET (50 MG TOTAL) BY MOUTH TWICE DAILY FOR 90 DAYS 180 Tablet 0    montelukast  (SINGULAIR ) 10 mg Oral Tablet Take 1 Tablet (10 mg total) by mouth Daily 90 Tablet 1    omeprazole  (PRILOSEC) 40 mg Oral Capsule, Delayed  Release(E.C.) Take 1 Capsule (40 mg total) by mouth Once per day as needed for Other 90 Capsule 1    pneumo 21-val conj-dip crm,PF, (CAPVAXIVE) 0.5 mL IntraMUSCULAR Syringe Inject 0.5 mL into the muscle One time for 1 dose 0.5 mL 0    promethazine -dextromethorphan (PHENERGAN -DM) 6.25-15 mg/5 mL Oral Syrup Take 5 mL by mouth Four times a day as needed for Cough 118 mL 0     No current facility-administered medications for this visit.     Allergies   Allergen Reactions    Cat Dander     Cockroach     Dog Dander     Grass Pollen     Mold     Tetanus Vaccines And Toxoid     Tree And Shrub Pollen      Review of Systems:  Any pertinent Review of Systems as addressed in the HPI above.    Objective:   BP 123/88   Pulse 88   Temp 36.2 C (97.1 F) (Temporal)   Resp 16   Ht 1.626 m (5\' 4" )   Wt 107 kg (236 lb 12.8 oz)   SpO2 96%   BMI 40.65 kg/m     BMI addressed: Advised on  diet, weight loss, and exercise to reduce above normal BMI.     Physical Exam  Vitals reviewed.   Constitutional:       General: She is not in acute distress.     Appearance: Normal appearance. She is obese.   HENT:      Head: Normocephalic.      Nose: Nose normal.      Mouth: Mucous membranes are moist.   Eyes:      Pupils: Pupils are equal, round, and reactive to light.   Cardiovascular:      Rate and Rhythm: Normal rate and regular rhythm.      Pulses: Normal pulses.      Heart sounds: Normal heart sounds.   Pulmonary:      Effort: Pulmonary effort is normal.      Breath sounds: Normal breath sounds.   Abdominal:      General: Bowel sounds are normal.      Palpations: Abdomen is soft.   Musculoskeletal:         General: Normal range of motion.      Cervical back: Normal range of motion.   Skin:     General: Skin is warm and dry.      Capillary Refill: Capillary refill takes less than 2 seconds.   Neurological:      General: No focal deficit present.      Mental Status: She is alert and oriented to person, place, and time. Mental status is at  baseline.   Psychiatric:         Attention and Perception: Attention normal.         Mood and Affect: Mood and affect normal.         Speech: Speech normal.         Behavior: Behavior normal. Behavior is cooperative.         Thought Content: Thought content normal.     Laboratory:  A1C: 5.9  A1C Date: 10/07/2023    COMPLETE BLOOD COUNT   Lab Results   Component Value Date    WBC 5.8 09/30/2023    HGB 14.6 (H) 09/30/2023    HCT 43.9 (H) 09/30/2023    PLTCNT 241 09/30/2023     DIFFERENTIAL  Lab Results   Component Value Date    PMNS 59 09/30/2023    LYMPHOCYTES 31 09/30/2023    MONOCYTES 6 09/30/2023    EOSINOPHIL 2 09/30/2023    BASOPHILS 1 09/30/2023    BASOPHILS 0.10 09/30/2023    PMNABS 3.40 09/30/2023    LYMPHSABS 1.80 09/30/2023    EOSABS 0.10 09/30/2023    MONOSABS 0.40 09/30/2023     BASIC METABOLIC PANEL  Lab Results   Component Value Date    SODIUM 140 09/30/2023    POTASSIUM 4.8 09/30/2023    CHLORIDE 105 09/30/2023    CO2 29 09/30/2023    ANIONGAP 6 09/30/2023    BUN 16 09/30/2023    CREATININE 1.00 09/30/2023    BUNCRRATIO 16 09/30/2023    GFR 63 09/30/2023    CALCIUM 9.6 09/30/2023    GLUCOSENF 136 (H) 09/30/2023      LIPID PROFILE  Lab Results   Component Value Date    CHOLESTEROL 153 09/30/2023    HDLCHOL 76 09/30/2023    LDLCHOL 51 09/30/2023    TRIG 130 09/30/2023     LIVER TESTS  Lab Results   Component Value Date    ALBUMIN 4.7 09/30/2023    AST 14  09/30/2023    ALT 16 09/30/2023    ALKPHOS 76 09/30/2023    TOTBILIRUBIN 0.5 09/30/2023    BILIRUBINCON 0.10 09/30/2023     THYROID  STIMULATING HORMONE  Lab Results   Component Value Date    TSH 2.209 09/30/2023   Most recent lab results reviewed with patient.  Assessment & Plan  Morbid obesity with BMI of 40.0-44.9, adult (CMS HCC)  Wt Readings from Last 3 Encounters:   04/04/24 107 kg (236 lb 12.8 oz)   11/24/23 104 kg (229 lb 9.6 oz)   10/07/23 101 kg (222 lb)   Patient was counseled on the benefits of weight loss.   Chronic obstructive pulmonary  disease, unspecified COPD type (CMS HCC)  QVAR inhaler effective. Refill order placed.  Essential hypertension  BP Readings from Last 3 Encounters:   04/04/24 123/88   11/24/23 134/89   10/07/23 (!) 139/96   BP WNL. Patient advised to remain on active regimen and maintain a home BP journal.  Hyperlipidemia, unspecified hyperlipidemia type  Lab Results   Component Value Date    TRIG 130 09/30/2023    HDLCHOL 76 09/30/2023    LDLCHOL 51 09/30/2023    CHOLESTEROL 153 09/30/2023   Lipitor 20 mg daily. Lipid panel ordered.  Osteoporosis screening  Dexa scan ordered.  Colon cancer screening  Referral placed with general surgery for screening colonoscopy.  Prediabetes  Lab Results   Component Value Date    HA1C 5.9 10/07/2023   Prediabetic A1C. No current treatment plan. Repeat A1C.  Breast cancer screening by mammogram  Bilateral mammogram ordered.  Dysuria  UA with culture reflex ordered.  Pneumococcal vaccination indicated  Capvaxive IM order sent to preferred pharmacy.    Counseling with regards to preventative care given. Medication summary was discussed with the patient to verify compliance and understanding. Medication safety was discussed. A good faith effort was made to reconcile the patient's medications. Lab orders were placed for BMP, CBC, Hepatic function panel, Lipid panel and TSH. The patient was given the opportunity to ask questions and those questions were answered to the patient's satisfaction. The patient was encouraged to call with any additional questions or concerns. More than 50% of the visit was spent counseling and coordinating care.    Orders Placed This Encounter    MAMMO BILATERAL SCREENING-ADDL VIEWS/BREAST US  AS REQ BY RAD    DEXA BONE DENSITOMETRY    BASIC METABOLIC PANEL    CBC/DIFF    HEPATIC FUNCTION PANEL    HGA1C (HEMOGLOBIN A1C WITH EST AVG GLUCOSE)    LIPID PANEL    THYROID  STIMULATING HORMONE WITH FREE T4 REFLEX    Referral to GENERAL SURGERY - [place] - DUREMDES, [facility]ULLINS, [facility]OPK[facility]     ipratropium bromide  (ATROVENT ) 21 mcg (0.03 %) Nasal nasal spray    promethazine -dextromethorphan (PHENERGAN -DM) 6.25-15 mg/5 mL Oral Syrup    beclomethasone dipropionate (QVAR REDIHALER) 80 mcg/actuation Inhalation oral inhaler    atorvastatin  (LIPITOR) 20 mg Oral Tablet    omeprazole  (PRILOSEC) 40 mg Oral Capsule, Delayed Release(E.C.)    montelukast  (SINGULAIR ) 10 mg Oral Tablet    amitriptyline  (ELAVIL ) 50 mg Oral Tablet    pneumo 21-val conj-dip crm,PF, (CAPVAXIVE) 0.5 mL IntraMUSCULAR Syringe     Return in about 6 months (around 10/04/2024).  Willadean Hark, APRN,NP-C 04/04/2024, 09:32

## 2024-04-04 NOTE — Assessment & Plan Note (Signed)
 QVAR inhaler effective. Refill order placed.

## 2024-04-04 NOTE — Assessment & Plan Note (Signed)
 Lab Results   Component Value Date    TRIG 130 09/30/2023    HDLCHOL 76 09/30/2023    LDLCHOL 51 09/30/2023    CHOLESTEROL 153 09/30/2023   Lipitor 20 mg daily. Lipid panel ordered.

## 2024-04-04 NOTE — Assessment & Plan Note (Signed)
 BP Readings from Last 3 Encounters:   04/04/24 123/88   11/24/23 134/89   10/07/23 (!) 139/96   BP WNL. Patient advised to remain on active regimen and maintain a home BP journal.

## 2024-04-06 ENCOUNTER — Ambulatory Visit (INDEPENDENT_AMBULATORY_CARE_PROVIDER_SITE_OTHER): Payer: Self-pay

## 2024-04-06 MED ORDER — METFORMIN ER 500 MG TABLET,EXTENDED RELEASE 24 HR
500.0000 mg | ORAL_TABLET | Freq: Every day | ORAL | 0 refills | Status: DC
Start: 2024-04-06 — End: 2024-07-08

## 2024-04-14 ENCOUNTER — Ambulatory Visit (INDEPENDENT_AMBULATORY_CARE_PROVIDER_SITE_OTHER): Payer: Self-pay | Admitting: Surgery

## 2024-04-26 ENCOUNTER — Ambulatory Visit (HOSPITAL_COMMUNITY): Payer: Self-pay

## 2024-04-29 ENCOUNTER — Other Ambulatory Visit (INDEPENDENT_AMBULATORY_CARE_PROVIDER_SITE_OTHER): Payer: Self-pay

## 2024-05-03 ENCOUNTER — Encounter (INDEPENDENT_AMBULATORY_CARE_PROVIDER_SITE_OTHER): Payer: Self-pay

## 2024-05-05 ENCOUNTER — Ambulatory Visit (INDEPENDENT_AMBULATORY_CARE_PROVIDER_SITE_OTHER): Admitting: Surgery

## 2024-05-05 ENCOUNTER — Other Ambulatory Visit: Payer: Self-pay

## 2024-05-25 ENCOUNTER — Other Ambulatory Visit (INDEPENDENT_AMBULATORY_CARE_PROVIDER_SITE_OTHER): Payer: Self-pay

## 2024-07-08 ENCOUNTER — Other Ambulatory Visit (INDEPENDENT_AMBULATORY_CARE_PROVIDER_SITE_OTHER): Payer: Self-pay

## 2024-07-30 ENCOUNTER — Other Ambulatory Visit (INDEPENDENT_AMBULATORY_CARE_PROVIDER_SITE_OTHER): Payer: Self-pay

## 2024-09-06 ENCOUNTER — Other Ambulatory Visit (HOSPITAL_COMMUNITY): Payer: Self-pay | Admitting: Orthopaedic Surgery

## 2024-09-06 ENCOUNTER — Inpatient Hospital Stay (INDEPENDENT_AMBULATORY_CARE_PROVIDER_SITE_OTHER)
Admission: RE | Admit: 2024-09-06 | Discharge: 2024-09-06 | Disposition: A | Discharge status: Home / Self Care | Source: Ambulatory Visit

## 2024-09-06 ENCOUNTER — Emergency Department
Admission: EM | Admit: 2024-09-06 | Discharge: 2024-09-06 | Disposition: A | Discharge status: Home / Self Care | Attending: PHYSICIAN ASSISTANT | Admitting: PHYSICIAN ASSISTANT

## 2024-09-06 ENCOUNTER — Other Ambulatory Visit: Payer: Self-pay

## 2024-09-06 DIAGNOSIS — M79661 Pain in right lower leg: Secondary | ICD-10-CM

## 2024-09-06 DIAGNOSIS — I82401 Acute embolism and thrombosis of unspecified deep veins of right lower extremity: Secondary | ICD-10-CM | POA: Insufficient documentation

## 2024-09-06 DIAGNOSIS — I82491 Acute embolism and thrombosis of other specified deep vein of right lower extremity: Secondary | ICD-10-CM

## 2024-09-06 MED ORDER — APIXABAN 5 MG TABLET
10.0000 mg | ORAL_TABLET | Freq: Two times a day (BID) | ORAL | Status: DC
Start: 2024-09-06 — End: 2024-09-06

## 2024-09-06 MED ORDER — APIXABAN 5 MG TABLET
ORAL_TABLET | ORAL | Status: AC
Start: 2024-09-06 — End: 2024-09-06
  Filled 2024-09-06: qty 2

## 2024-09-06 MED ORDER — APIXABAN 5 MG TABLET
10.0000 mg | ORAL_TABLET | ORAL | Status: AC
Start: 2024-09-06 — End: 2024-09-06
  Administered 2024-09-06: 10 mg via ORAL

## 2024-09-06 MED ORDER — APIXABAN 5 MG TABLET
5.0000 mg | ORAL_TABLET | Freq: Two times a day (BID) | ORAL | Status: DC
Start: 2024-09-13 — End: 2024-09-06

## 2024-09-06 NOTE — Discharge Instructions (Addendum)
It is very important that you follow up with your doctor to discuss all of your results from today's visit.  Discuss any new medications prescribed to be sure these do not interact with any you may already be taking.  Do not drive or operate heavy machinery while taking any pain medication or muscle relaxers.    Please return immediately if your condition worsens, further concerns arise, or if you have trouble getting a followup appointment.

## 2024-09-06 NOTE — ED Provider Notes (Signed)
 [facility] - Emergency Department  ED Primary Note  History of Present Illness   Heather Atkins is a 68 y.o. female who had concerns including Medication Refill.  Patient fell Saturday onto her right knee, was seen by Dr. Joesph, orthopedic surgery, today.  Sent her for deep vein thrombosis study because she is having pain in the posterior aspect of her knee.  The study was reportedly positive.  She denies chest pain or shortness for breath.  She haggled with her insurance all day, and by the time this script was finally filled, the pharmacy had closed.      Physical Exam   ED Triage Vitals [09/06/24 2123]   BP    Heart Rate 92   Respiratory Rate 20   Temperature 36.6 C (97.8 F)   SpO2 98 %   Weight 107 kg (235 lb)   Height 1.575 m (5' 2)     CONSTITUTIONAL: _Patient sitting fully erect in triage room chair.  SKIN: _visible skin warm and dry  EYES: _extraocular movements are grossly intact, clear conjunctiva  HENT: _Normocephalic, atraumatic, moist mucus membranes  NECK: _no obvious swelling, normal range of motion  PULMONARY: _normal chest rise and fall, no respiratory distress or stridor  CARDIOVASCULAR: _regular rate, distal extremities are warm and well perfused  GASTROINTESTINAL: _nondistended, non-tender  NEUROLOGIC: _A&O x3, GCS 15, normal speech, moves all extremities  MUSCULOSKELETAL: _ mildly swollen right lower extremity, no gross deformities, atraumatic  PSYCHIATRIC: _normal mood and affect      Patient Data   Labs Ordered/Reviewed - No data to display  No orders to display     Medical Decision Making        Medical Decision Making  Risk  Prescription drug management.    68 year old female seen and examined in the emergency department with complaints of acute deep vein thrombosis.  Vital signs stable.  Physical exam reveals mild swelling right lower extremity.  Patient denies chest pain or shortness for breath.  Vital signs appear normal.  Patient has no other complaints.  She  will be initiated on Eliquis .  She is scheduled for MRI later this week.  Patient was given clear warnings and conservative instructions regarding Eliquis  use.  She is instructed to return to the emergency department if she experiences worrisome or worsening symptoms.  Patient educated as to what those symptoms might be.  Patient acknowledged and is agreeable to plan.  She will be discharged home in stable condition.              Medications Ordered/Administered in the ED   apixaban  (ELIQUIS ) tablet (10 mg Oral Given 09/06/24 2127)     Clinical Impression   Leg DVT (deep venous thromboembolism), acute, right (CMS HCC) (Primary)       Disposition: Discharged

## 2024-09-06 NOTE — ED Triage Notes (Signed)
 R leg pain since a fall Saturday. Had an US  on R leg today and had a positive DVT. Prescription was written for Eliquis  but patient was unable to get medication filled.

## 2024-09-07 DIAGNOSIS — I82431 Acute embolism and thrombosis of right popliteal vein: Secondary | ICD-10-CM

## 2024-09-07 DIAGNOSIS — I82441 Acute embolism and thrombosis of right tibial vein: Secondary | ICD-10-CM

## 2024-09-08 ENCOUNTER — Telehealth (INDEPENDENT_AMBULATORY_CARE_PROVIDER_SITE_OTHER): Payer: Self-pay

## 2024-09-08 NOTE — Telephone Encounter (Signed)
 Patient informed. Voices understanding and agreement.

## 2024-09-08 NOTE — Nursing Note (Signed)
 Received call from patient stating that she had been diagnosed with 2 blood clots in her leg on Tuesday and started on Eliquis  and since starting medication she has developed a strange headache. States she was advised to alert her PCP if this happened but she doesn't have an appt with Caleb until 10/3. Patient asks can she take anything for it? Does she need to go to the ER? Please advise

## 2024-09-12 NOTE — Telephone Encounter (Signed)
 Post Ed Follow-Up    Post ED Follow-Up:   Document completed and/or attempted interactive contact(s) after transition to home after emergency department stay.:   Transition Facility and relevant Date:   Discharge Date: 09/06/24  Discharge from [facility] Emergency Department?: Yes  Discharge Facility: [facility]  Contacted by: Heather Atkins, Heather Atkins  Contact method: Patient/Caregiver Telephone  Contact first attempt: 09/12/2024  5:16 PM  MyChart message sent?: No  Did the patient attempt to reach their PCP prior to going to the ED?: No  Follow Up Visit: No answer - left message  [place], Heather Atkins  09/12/2024 17:17

## 2024-09-16 ENCOUNTER — Other Ambulatory Visit: Payer: Self-pay

## 2024-09-16 ENCOUNTER — Ambulatory Visit: Payer: Self-pay

## 2024-09-16 ENCOUNTER — Encounter (INDEPENDENT_AMBULATORY_CARE_PROVIDER_SITE_OTHER): Payer: Self-pay

## 2024-09-16 VITALS — BP 112/64 | HR 100 | Temp 97.3°F | Wt 236.6 lb

## 2024-09-16 DIAGNOSIS — Z09 Encounter for follow-up examination after completed treatment for conditions other than malignant neoplasm: Secondary | ICD-10-CM | POA: Insufficient documentation

## 2024-09-16 DIAGNOSIS — M25561 Pain in right knee: Secondary | ICD-10-CM

## 2024-09-16 DIAGNOSIS — Z9181 History of falling: Secondary | ICD-10-CM

## 2024-09-16 DIAGNOSIS — Z7901 Long term (current) use of anticoagulants: Secondary | ICD-10-CM

## 2024-09-16 DIAGNOSIS — I82409 Acute embolism and thrombosis of unspecified deep veins of unspecified lower extremity: Secondary | ICD-10-CM

## 2024-09-16 DIAGNOSIS — M25461 Effusion, right knee: Secondary | ICD-10-CM

## 2024-09-16 DIAGNOSIS — R42 Dizziness and giddiness: Secondary | ICD-10-CM

## 2024-09-16 DIAGNOSIS — E119 Type 2 diabetes mellitus without complications: Secondary | ICD-10-CM

## 2024-09-16 MED ORDER — LOSARTAN 50 MG TABLET: 50.0000 mg | ORAL_TABLET | Freq: Every day | ORAL | 0 refills | Status: AC

## 2024-09-16 MED ORDER — ATORVASTATIN 20 MG TABLET
20.0000 mg | ORAL_TABLET | Freq: Every day | ORAL | 1 refills | Status: AC
Start: 2024-09-16 — End: ?

## 2024-09-16 MED ORDER — AMITRIPTYLINE 50 MG TABLET
50.0000 mg | ORAL_TABLET | Freq: Every evening | ORAL | 1 refills | Status: AC
Start: 2024-09-16 — End: ?

## 2024-09-16 MED ORDER — AMOXICILLIN 875 MG TABLET
875.0000 mg | ORAL_TABLET | Freq: Two times a day (BID) | ORAL | 0 refills | Status: AC
Start: 2024-09-16 — End: 2024-09-23

## 2024-09-16 MED ORDER — LOSARTAN 50 MG TABLET
50.0000 mg | ORAL_TABLET | Freq: Two times a day (BID) | ORAL | 0 refills | Status: DC
Start: 2024-09-16 — End: 2024-09-16

## 2024-09-16 MED ORDER — MONTELUKAST 10 MG TABLET
10.0000 mg | ORAL_TABLET | Freq: Every day | ORAL | 1 refills | Status: AC
Start: 2024-09-16 — End: ?

## 2024-09-16 MED ORDER — DESLORATADINE 5 MG TABLET: 5.0000 mg | ORAL_TABLET | Freq: Every day | ORAL | 1 refills | Status: DC

## 2024-09-16 MED ORDER — IPRATROPIUM BROMIDE 21 MCG (0.03 %) NASAL SPRAY
2.0000 | Freq: Two times a day (BID) | NASAL | 3 refills | Status: AC | PRN
Start: 2024-09-16 — End: ?

## 2024-09-16 MED ORDER — BECLOMETHASONE DIPROP 80 MCG/ACTUATION HFA BREATH ACTIVATED AEROSOL
1.0000 | INHALATION_SPRAY | Freq: Two times a day (BID) | RESPIRATORY_TRACT | 2 refills | Status: AC
Start: 2024-09-16 — End: ?

## 2024-09-16 MED ORDER — OMEPRAZOLE 40 MG CAPSULE,DELAYED RELEASE
40.0000 mg | DELAYED_RELEASE_CAPSULE | Freq: Every day | ORAL | 1 refills | Status: AC | PRN
Start: 2024-09-16 — End: ?

## 2024-09-16 MED ORDER — APIXABAN 5 MG TABLET
5.0000 mg | ORAL_TABLET | Freq: Two times a day (BID) | ORAL | 1 refills | Status: AC
Start: 2024-09-16 — End: ?

## 2024-09-16 NOTE — Nursing Note (Signed)
 09/16/24 1035   Fall Risk Assessment   Do you feel unsteady when standing or walking? No   Do you worry about falling? Yes   Have you fallen in the past year? Yes   How many times have you fallen? 2 or more times   Were you ever injured from falling? Yes   Timed up and go test (in seconds) 5

## 2024-09-16 NOTE — Nursing Note (Signed)
 09/16/24 1037   Depression Screen   Little interest or pleasure in doing things. 0   Feeling down, depressed, or hopeless 0   PHQ 2 Total 0

## 2024-09-16 NOTE — Progress Notes (Signed)
 PRN MEDICAL OFFICE [facility]  FAMILY MEDICINE, MEDICAL OFFICE BUILDING  [address]  [place] [address]  [phone]   Heather Atkins  Jul 23, 1956  [id number]    Date of Service: 09/16/2024  Chief complaint:   Chief Complaint   Patient presents with    ED Follow-up     Pt was in the ed on 9/23. For blood clots. Pt has a follow up with ortho on the 09/21/24     Subjective:   History of Present Illness    Heather Atkins is a 68 year old female who presents for follow-up after an ER visit for deep vein thrombosis.    She was diagnosed with deep vein thrombosis (DVT) in the right popliteal and calf regions following a fall on her right knee on September 20th. A venous duplex study confirmed the DVT, and she was prescribed Eliquis  (apixaban ) 5 mg, taking two tablets by mouth twice daily for seven days, then one tablet twice daily for thirty days.    She has experienced right leg pain and swelling for two months, initially attributing it to a muscle strain from increased physical activity after her retirement on June 13th. The pain and swelling worsened significantly after a family reunion at [facility], leading to an ER visit where the DVT was diagnosed.    She experiences dizziness when changing positions, particularly when lying down or getting up from bed, describing it as 'the whole room swirls'. She has had similar symptoms in the past, which were relieved by chiropractic adjustments, but this time the dizziness persists. She suspects it might be vertigo but is unsure.       Past Medical History:   Diagnosis Date    Allergic rhinitis     Asthma     COPD (chronic obstructive pulmonary disease)     Esophageal reflux     Essential hypertension     Migraine      Past Surgical History:   Procedure Laterality Date    HX TONSILLECTOMY      SINUS SURGERY       Current Outpatient Medications   Medication Sig Dispense Refill    amitriptyline  (ELAVIL ) 50 mg Oral Tablet Take 1 Tablet (50 mg total) by mouth  Every night 90 Tablet 1    apixaban  (ELIQUIS ) 5 mg Oral Tablet Take 1 Tablet (5 mg total) by mouth Twice daily 150 Tablet 1    atorvastatin  (LIPITOR) 20 mg Oral Tablet Take 1 Tablet (20 mg total) by mouth Daily 90 Tablet 1    beclomethasone dipropionate (QVAR REDIHALER) 80 mcg/actuation Inhalation oral inhaler Take 1 Puff by inhalation Twice daily 10.6 g 2    Butalbital-Acetaminophen-Caff 50-300-40 mg Oral Capsule TAKE 1 CAPSULE BY MOUTH EVERY 6 HOURS AS NEEDED (Patient taking differently: Take 1 Capsule by mouth) 30 Capsule 0    desloratadine  (CLARINEX ) 5 mg Oral Tablet Take 1 Tablet (5 mg total) by mouth Daily 90 Tablet 1    ipratropium bromide  (ATROVENT ) 21 mcg (0.03 %) Nasal nasal spray Administer 2 Sprays into affected nostril(s) Twice per day as needed 30 mL 3    losartan  (COZAAR ) 50 mg Oral Tablet Take 1 Tablet (50 mg total) by mouth Daily 90 Tablet 0    montelukast  (SINGULAIR ) 10 mg Oral Tablet Take 1 Tablet (10 mg total) by mouth Daily 90 Tablet 1    omeprazole  (PRILOSEC) 40 mg Oral Capsule, Delayed Release(E.C.) Take 1 Capsule (40 mg total) by mouth Once per day  as needed for Other 90 Capsule 1     No current facility-administered medications for this visit.     Allergies[1]    Review of Systems:  Any pertinent Review of Systems as addressed in the HPI above.    Objective:   BP 112/64 (Site: Left Arm, Patient Position: Supine)   Pulse 100   Temp 36.3 C (97.3 F) (Temporal)   Wt 107 kg (236 lb 9.6 oz)   SpO2 92%   BMI 43.27 kg/m     BMI addressed: Advised on diet, weight loss, and exercise to reduce above normal BMI.     Physical Exam    CONSTITUTIONAL: Patient is not in acute distress. Normal appearance. Hemodynamically stable.  HENT: Mucous membranes are moist. Pupils are equal, round, and reactive to light.  CARDIOVASCULAR: Normal rate and regular rhythm. Normal pulses. Normal heart sounds. No murmur heard.  PULMONARY: Pulmonary effort is normal. Normal breath sounds.  ABDOMINAL: Bowel sounds are  normal. Abdomen is soft and non-tender.  MUSCULOSKELETAL: No swelling or deformity. No tenderness. Right lower leg- No edema. Left lower leg- No edema.  LYMPHADENOPATHY: No cervical adenopathy.  SKIN: Skin is warm and dry.  NEUROLOGICAL: No focal deficit present. She is alert and oriented to person, place, and time.  PSYCHIATRIC: Mood and affect normal.        Results    DIAGNOSTIC  Venous duplex ultrasound: Positive for deep vein thrombosis (DVT) in the right popliteal and calf (09/06/2024)     Most recent results reviewed with patient.      ICD-10-CM    1. Follow-up exam  Z09       2. DVT (deep venous thrombosis)  I82.409       3. 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 No signs of pulmonary embolism.  - Continue Eliquis  5 mg twice daily. Dispense 150 tablets with one refill.  - Follow up with orthopedic surgery on October 8th for MRI results.    Right knee pain and swelling after fall  Awaiting MRI results to assess for structural injury.  - Use walker and avoid weight-bearing on right leg until MRI results are reviewed.    Dizziness, possible orthostatic hypotension  Symptoms suggestive of orthostatic hypotension. Differential includes vertigo or inner ear abnormality.  - Perform orthostatic blood pressure test.    Type 2 diabetes mellitus without complications  A1c levels manageable with diet and exercise. Previously on metformin , discontinued due to side effects.  - Monitor blood glucose levels regularly.  - Encourage lifestyle modifications including diet and exercise.        Counseling with regards to preventative care given. Medication summary was discussed with the patient to verify compliance and understanding. Medication safety was discussed. A good faith effort was made to reconcile the patient's medications. The patient was given the opportunity to ask questions and those questions were answered to the  patient's satisfaction. The patient was encouraged to call with any additional questions or concerns. More than 50% of the visit was spent counseling and coordinating care.    Orders Placed This Encounter    amitriptyline  (ELAVIL ) 50 mg Oral Tablet    omeprazole  (PRILOSEC) 40 mg Oral Capsule, Delayed Release(E.C.)    montelukast  (SINGULAIR ) 10 mg Oral Tablet    ipratropium bromide  (ATROVENT ) 21 mcg (0.03 %) Nasal nasal spray  desloratadine  (CLARINEX ) 5 mg Oral Tablet    beclomethasone dipropionate (QVAR REDIHALER) 80 mcg/actuation Inhalation oral inhaler    atorvastatin  (LIPITOR) 20 mg Oral Tablet    apixaban  (ELIQUIS ) 5 mg Oral Tablet    losartan  (COZAAR ) 50 mg Oral Tablet     Depression screening is negative. PHQ 2 Total: 0     Return if symptoms worsen or fail to improve.  [employer], APRN, CNP 09/16/2024, 11:25  This note was created with assistance from [employer] via capture of conversational audio. Consent was obtained from the patient and all parties present prior to recording.         [1]   Allergies  Allergen Reactions    Cat Dander     Cockroach     Dog Dander     Grass Pollen     Mold     Tetanus Vaccines And Toxoid     Tree And Shrub Pollen

## 2024-09-16 NOTE — Nursing Note (Signed)
 09/16/24 1033   Health Education and Literacy   How often do you have a problem understanding what is told to you about your medical condition?  Always   Domestic Violence   Because we are aware of abuse and domestic violence today, we ask all patients: Are you being hurt, hit, or frightened by anyone at your home or in your life?  N   Basic Needs   Do you have any basic needs within your home that are not being met? (such as Food, Shelter, [employer] fast streamer, Tranportation, paying for bills and/or medications) N   Advanced Directives   Do you have any advanced directives? No Advance

## 2024-10-04 ENCOUNTER — Encounter (INDEPENDENT_AMBULATORY_CARE_PROVIDER_SITE_OTHER): Payer: Self-pay

## 2024-10-04 ENCOUNTER — Ambulatory Visit (HOSPITAL_BASED_OUTPATIENT_CLINIC_OR_DEPARTMENT_OTHER)

## 2024-10-04 ENCOUNTER — Other Ambulatory Visit: Payer: Self-pay

## 2024-10-04 ENCOUNTER — Ambulatory Visit: Payer: Self-pay

## 2024-10-04 VITALS — BP 106/86 | HR 91 | Temp 97.5°F | Resp 18 | Ht 62.0 in | Wt 236.9 lb

## 2024-10-04 VITALS — BP 106/86 | HR 91 | Temp 97.5°F | Resp 18 | Ht 62.0 in | Wt 235.9 lb

## 2024-10-04 DIAGNOSIS — Z780 Asymptomatic menopausal state: Secondary | ICD-10-CM

## 2024-10-04 DIAGNOSIS — I10 Essential (primary) hypertension: Secondary | ICD-10-CM

## 2024-10-04 DIAGNOSIS — Z1231 Encounter for screening mammogram for malignant neoplasm of breast: Secondary | ICD-10-CM | POA: Insufficient documentation

## 2024-10-04 DIAGNOSIS — Z23 Encounter for immunization: Secondary | ICD-10-CM

## 2024-10-04 DIAGNOSIS — Z13820 Encounter for screening for osteoporosis: Secondary | ICD-10-CM

## 2024-10-04 DIAGNOSIS — G43909 Migraine, unspecified, not intractable, without status migrainosus: Secondary | ICD-10-CM

## 2024-10-04 DIAGNOSIS — J45909 Unspecified asthma, uncomplicated: Secondary | ICD-10-CM

## 2024-10-04 DIAGNOSIS — Z0000 Encounter for general adult medical examination without abnormal findings: Secondary | ICD-10-CM

## 2024-10-04 DIAGNOSIS — Z2911 Encounter for prophylactic immunotherapy for respiratory syncytial virus (RSV): Secondary | ICD-10-CM | POA: Insufficient documentation

## 2024-10-04 DIAGNOSIS — Z6841 Body Mass Index (BMI) 40.0 and over, adult: Secondary | ICD-10-CM

## 2024-10-04 DIAGNOSIS — K219 Gastro-esophageal reflux disease without esophagitis: Secondary | ICD-10-CM

## 2024-10-04 DIAGNOSIS — Z1211 Encounter for screening for malignant neoplasm of colon: Secondary | ICD-10-CM | POA: Insufficient documentation

## 2024-10-04 DIAGNOSIS — Z7189 Other specified counseling: Secondary | ICD-10-CM | POA: Insufficient documentation

## 2024-10-04 DIAGNOSIS — J449 Chronic obstructive pulmonary disease, unspecified: Secondary | ICD-10-CM

## 2024-10-04 DIAGNOSIS — J309 Allergic rhinitis, unspecified: Secondary | ICD-10-CM

## 2024-10-04 DIAGNOSIS — E785 Hyperlipidemia, unspecified: Secondary | ICD-10-CM

## 2024-10-04 LAB — HEPATIC FUNCTION PANEL
ALBUMIN/GLOBULIN RATIO: 1.9 — ABNORMAL HIGH (ref 0.8–1.4)
ALBUMIN: 3.9 g/dL (ref 3.5–5.7)
ALKALINE PHOSPHATASE: 46 U/L (ref 34–104)
ALT (SGPT): 17 U/L (ref 7–52)
AST (SGOT): 13 U/L (ref 13–39)
BILIRUBIN DIRECT: 0.08 md/dL (ref 0.03–0.18)
BILIRUBIN TOTAL: 0.5 mg/dL (ref 0.3–1.0)
BILIRUBIN, INDIRECT: 0.42 mg/dL (ref <=1)
GLOBULIN: 2.1 (ref 2.0–3.5)
PROTEIN TOTAL: 6 g/dL — ABNORMAL LOW (ref 6.4–8.9)

## 2024-10-04 LAB — CBC WITH DIFF
BASOPHIL #: 0.1 x10ˆ3/uL (ref 0.00–0.10)
BASOPHIL %: 1 % (ref 0–1)
EOSINOPHIL #: 0.5 x10ˆ3/uL (ref 0.00–0.50)
EOSINOPHIL %: 6 % (ref 1–7)
HCT: 44.9 % — ABNORMAL HIGH (ref 31.2–41.9)
HGB: 15.2 g/dL — ABNORMAL HIGH (ref 10.9–14.3)
LYMPHOCYTE #: 1.8 x10ˆ3/uL (ref 1.10–3.10)
LYMPHOCYTE %: 24 % (ref 16–46)
MCH: 32.1 pg (ref 24.7–32.8)
MCHC: 33.9 g/dL (ref 32.3–35.6)
MCV: 94.7 fL (ref 75.5–95.3)
MONOCYTE #: 0.5 x10ˆ3/uL (ref 0.20–0.90)
MONOCYTE %: 7 % (ref 4–11)
MPV: 8.8 fL (ref 7.9–10.8)
NEUTROPHIL #: 4.7 x10ˆ3/uL (ref 1.90–8.20)
NEUTROPHIL %: 62 % (ref 43–77)
PLATELETS: 265 x10ˆ3/uL (ref 140–440)
RBC: 4.74 x10ˆ6/uL (ref 3.63–4.92)
RDW: 13.2 % (ref 12.3–17.7)
WBC: 7.5 x10ˆ3/uL (ref 3.8–11.8)

## 2024-10-04 LAB — BASIC METABOLIC PANEL
ANION GAP: 9 mmol/L (ref 4–13)
BUN/CREA RATIO: 15 (ref 6–22)
BUN: 15 mg/dL (ref 7–25)
CALCIUM: 9.1 mg/dL (ref 8.6–10.3)
CHLORIDE: 106 mmol/L (ref 98–107)
CO2 TOTAL: 24 mmol/L (ref 21–31)
CREATININE: 1.01 mg/dL (ref 0.60–1.30)
ESTIMATED GFR: 61 mL/min/1.73mˆ2 (ref >59)
GLUCOSE: 130 mg/dL — ABNORMAL HIGH (ref 74–109)
OSMOLALITY, CALCULATED: 280 mosm/kg (ref 270–290)
POTASSIUM: 3.8 mmol/L (ref 3.5–5.1)
SODIUM: 139 mmol/L (ref 136–145)

## 2024-10-04 LAB — LIPID PANEL
CHOL/HDL RATIO: 2.3
CHOLESTEROL: 130 mg/dL (ref <200)
HDL CHOL: 56 mg/dL (ref >=40)
LDL CALC: 37 mg/dL (ref 0–100)
TRIGLYCERIDES: 186 mg/dL — ABNORMAL HIGH (ref <=150)
VLDL CALC: 37 mg/dL (ref 0–50)

## 2024-10-04 LAB — THYROID STIMULATING HORMONE WITH FREE T4 REFLEX: TSH: 1.622 u[IU]/mL (ref 0.450–5.330)

## 2024-10-04 MED ORDER — VARICELLA-ZOSTER GLYCOE VACC-AS01B ADJ(PF) 50 MCG/0.5 ML IM SUSP, KIT
0.5000 mL | INHALATION_SUSPENSION | Freq: Once | INTRAMUSCULAR | 0 refills | Status: DC
Start: 2024-10-04 — End: 2024-11-02

## 2024-10-04 MED ORDER — TRAMADOL 50 MG TABLET
1.0000 | ORAL_TABLET | Freq: Three times a day (TID) | ORAL | 0 refills | Status: AC | PRN
Start: 2024-10-04 — End: ?

## 2024-10-04 MED ORDER — AREXVY (PF) 120 MCG/0.5 ML IM SUSPENSION
0.5000 mL | INHALATION_SUSPENSION | Freq: Once | INTRAMUSCULAR | 0 refills | Status: DC
Start: 2024-10-04 — End: 2024-11-02

## 2024-10-04 MED ORDER — PREVNAR 20 (PF) 0.5 ML INTRAMUSCULAR SYRINGE
0.5000 mL | INJECTION | Freq: Once | INTRAMUSCULAR | 0 refills | Status: DC
Start: 2024-10-04 — End: 2024-11-02

## 2024-10-04 NOTE — Progress Notes (Signed)
 PRN MEDICAL OFFICE [facility]  FAMILY MEDICINE, MEDICAL OFFICE BUILDING  [address]  [place] [address]  [phone]   EDNAH HAMMOCK  10-22-1956  [id number]    Date of Service: 10/04/2024  Chief complaint:   Chief Complaint   Patient presents with    Follow Up 6 Months     Subjective:   History of Present Illness    Heather Atkins is a 68 year old female with a history of DVT who presents for follow-up on chronic disease management. She is accompanied by her husband, Sharmila Wrobleski.    She is here for follow-up on her chronic disease management, particularly concerning her recent deep vein thrombosis (DVT) in the right popliteal vein, diagnosed in September. She is currently on Eliquis  5 mg BID. She experiences persistent pain, initially in the calf and behind the knee, now localized to the front of the leg, described as severe enough to cause tears. She questions whether the pain is related to the DVT or other issues such as a meniscal injury or pulled muscle.    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 Imaging including x-ray, ultrasound, and MRI have been performed, but she does not have the results. She has been using tramadol for pain management, taking up to three 50 mg tablets a day, which provides some relief. She avoids NSAIDs while on blood thinners.    Her medication regimen includes Elavil  50 mg QHS, Lipitor 20 mg daily, Qvar inhaler BID, Fioricet PRN for migraines, Clarinex  5 mg daily, Atrovent , losartan  50 mg, Singulair  10 mg daily, and Prilosec 40 mg daily. She also has access to an albuterol rescue inhaler provided by her daughter.    She has a history of hypertension, hyperlipidemia, asthma, COPD, migraines, GERD, allergic rhinitis, and severe obesity. Her last mammogram was scheduled for May 2025 but was not completed. She has not been taking metformin  as she intended to manage her A1c through lifestyle changes, but her efforts were hindered by her recent  health issues.    She is concerned about her quality of life due to the pain. She also inquires about receiving a flu shot and COVID vaccine, noting previous adverse reactions when taken together.    No recent illness. Her congestion cleared up with antibiotics.        Past Medical History:   Diagnosis Date    Allergic rhinitis     Asthma     COPD (chronic obstructive pulmonary disease)     Esophageal reflux     Essential hypertension     Migraine      Past Surgical History:   Procedure Laterality Date    HX TONSILLECTOMY      SINUS SURGERY       Current Outpatient Medications   Medication Sig Dispense Refill    amitriptyline  (ELAVIL ) 50 mg Oral Tablet Take 1 Tablet (50 mg total) by mouth Every night 90 Tablet 1    apixaban  (ELIQUIS ) 5 mg Oral Tablet Take 1 Tablet (5 mg total) by mouth Twice daily 150 Tablet 1    atorvastatin  (LIPITOR) 20 mg Oral Tablet Take 1 Tablet (20 mg total) by mouth Daily 90 Tablet 1    beclomethasone dipropionate (QVAR REDIHALER) 80 mcg/actuation Inhalation oral inhaler Take 1 Puff by inhalation Twice daily 10.6 g 2    Butalbital-Acetaminophen-Caff 50-300-40 mg Oral Capsule TAKE 1 CAPSULE BY MOUTH EVERY 6 HOURS AS NEEDED (Patient taking differently: Take 1  Capsule by mouth) 30 Capsule 0    desloratadine  (CLARINEX ) 5 mg Oral Tablet Take 1 Tablet (5 mg total) by mouth Daily 90 Tablet 1    ipratropium bromide  (ATROVENT ) 21 mcg (0.03 %) Nasal nasal spray Administer 2 Sprays into affected nostril(s) Twice per day as needed 30 mL 3    losartan  (COZAAR ) 50 mg Oral Tablet Take 1 Tablet (50 mg total) by mouth Daily 90 Tablet 0    montelukast  (SINGULAIR ) 10 mg Oral Tablet Take 1 Tablet (10 mg total) by mouth Daily 90 Tablet 1    omeprazole  (PRILOSEC) 40 mg Oral Capsule, Delayed Release(E.C.) Take 1 Capsule (40 mg total) by mouth Once per day as needed for Other 90 Capsule 1    pneumoc 20-valent conjugate vaccine (PREVNAR 20, PF,) IntraMUSCULAR Syringe Inject 0.5 mL into the muscle One time for 1 dose  0.5 mL 0    RSVPreF3 antigen-AS01E, PF, (AREXVY, PF,) 120 mcg/0.5 mL IntraMUSCULAR Suspension for Reconstitution Inject 0.5 mL into the muscle One time for 1 dose 0.5 mL 0    traMADoL (ULTRAM) 50 mg Oral Tablet Take 1 Tablet (50 mg total) by mouth Every 8 hours as needed for Pain 30 Tablet 0    varicella-zoster, PF, 50 mcg/0.5 mL IntraMUSCULAR Suspension for Reconstitution Inject 0.5 mL into the muscle One time for 1 dose 0.5 mL 0     No current facility-administered medications for this visit.     Allergies[1]    Review of Systems:  Any pertinent Review of Systems as addressed in the HPI above.    Objective:   BP 106/86   Pulse 91   Temp 36.4 C (97.5 F) (Temporal)   Resp 18   Ht 1.575 m (5' 2)   Wt 107 kg (236 lb 14.4 oz)   SpO2 93%   BMI 43.33 kg/m     BMI addressed: Advised on diet, weight loss, and exercise to reduce above normal BMI.     Physical Exam    CONSTITUTIONAL: Patient is not in acute distress. Normal appearance.  HENT: Mucous membranes are moist. Pupils are equal, round, and reactive to light.  CARDIOVASCULAR: Normal rate and regular rhythm.  Normal pulses.  Normal heart sounds. No murmur heard.  PULMONARY: Pulmonary effort is normal. Normal breath sounds.  MUSCULOSKELETAL: No swelling or deformity. No tenderness.  SKIN: Skin is warm and dry.  NEUROLOGICAL: No focal deficit present. Alert and oriented to person, place, and time.  PSYCHIATRIC: Mood and affect normal.        Laboratory:  A1C: 6.1  A1C Date: 04/04/2024    COMPLETE BLOOD COUNT   Lab Results   Component Value Date    WBC 5.9 04/04/2024    HGB 14.1 04/04/2024    HCT 41.8 04/04/2024    PLTCNT 203 04/04/2024     DIFFERENTIAL  Lab Results   Component Value Date    PMNS 59 04/04/2024    LYMPHOCYTES 31 04/04/2024    MONOCYTES 6 04/04/2024    EOSINOPHIL 2 04/04/2024    BASOPHILS 1 04/04/2024    BASOPHILS 0.10 04/04/2024    PMNABS 3.50 04/04/2024    LYMPHSABS 1.90 04/04/2024    EOSABS 0.10 04/04/2024    MONOSABS 0.40 04/04/2024      BASIC METABOLIC PANEL  Lab Results   Component Value Date    SODIUM 140 04/04/2024    POTASSIUM 4.6 04/04/2024    CHLORIDE 108 (H) 04/04/2024    CO2 24 04/04/2024    ANIONGAP  8 04/04/2024    BUN 18 04/04/2024    CREATININE 0.85 04/04/2024    BUNCRRATIO 21 04/04/2024    GFR 76 04/04/2024    CALCIUM 9.7 04/04/2024    GLUCOSE Negative 04/04/2024    GLUCOSENF 143 (H) 04/04/2024      LIPID PROFILE  Lab Results   Component Value Date    CHOLESTEROL 163 04/04/2024    HDLCHOL 72 04/04/2024    LDLCHOL 67 04/04/2024    TRIG 120 04/04/2024     LIVER TESTS  Lab Results   Component Value Date    ALBUMIN 4.5 04/04/2024    AST 17 04/04/2024    ALT 25 04/04/2024    ALKPHOS 73 04/04/2024    TOTBILIRUBIN 0.4 04/04/2024    BILIRUBINCON 0.08 04/04/2024     THYROID  STIMULATING HORMONE  Lab Results   Component Value Date    TSH 2.145 04/04/2024      Results    DIAGNOSTIC  Venous duplex: Acute DVT in right popliteal vein, posterior tibial vein, and peroneal veins (08/2024)        Most recent results reviewed with patient.      ICD-10-CM    1. Morbid obesity with BMI of 40.0-44.9, adult (CMS HCC)  E66.01 BASIC METABOLIC PANEL    Z68.41 CBC/DIFF     HEPATIC FUNCTION PANEL     HGA1C (HEMOGLOBIN A1C WITH EST AVG GLUCOSE)     LIPID PANEL     THYROID  STIMULATING HORMONE WITH FREE T4 REFLEX      2. Need for immunization against influenza  Z23 Flu Vaccine, 65+,0.5 mL IM (Admin)      3. Essential hypertension  I10 CBC/DIFF     HEPATIC FUNCTION PANEL      4. Hyperlipidemia, unspecified hyperlipidemia type  E78.5 BASIC METABOLIC PANEL     HGA1C (HEMOGLOBIN A1C WITH EST AVG GLUCOSE)     LIPID PANEL      5. Chronic obstructive pulmonary disease, unspecified COPD type (CMS HCC)  J44.9       6. Asthma, unspecified asthma severity, unspecified whether complicated, unspecified whether persistent  J45.909       7. Gastroesophageal reflux disease, unspecified whether esophagitis present  K21.9       8. Migraine  G43.909       9. Allergic rhinitis  J30.9           Assessment & Plan     Deep vein thrombosis, right lower extremity (popliteal, posterior tibial, peroneal)  Acute DVT in right popliteal, posterior tibial, and peroneal veins diagnosed one month ago. Pain atypical for DVT, no swelling present. Imaging unnecessary as Eliquis  is prescribed. Clot resolution expected in six to twelve weeks.  - Continue Eliquis  5 mg BID.    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 Severe pain impacts quality of life. Tramadol provides partial relief. NSAIDs contraindicated due to stomach ulcer risk with blood thinners. Compression support considered safe.  - Prescribe Tramadol 50 mg, up to three times daily as needed.  - Consider use of a knee brace or compression sleeve for support.  - Avoid NSAIDs due to risk of stomach ulcers while on blood thinners.    Prediabetes  Previous A1c was climbing. Lifestyle changes advised but not successful due to recent health issues. Plan to recheck A1c to assess need for metformin .  - Order A1c test  to reassess glucose control.    Asthma and chronic obstructive pulmonary disease (COPD)  Current medications include Qvar, Atrovent , and Singulair .    General Health Maintenance  Discussed flu and COVID vaccinations. Advised flu vaccine to prevent illness and reduce DVT risk. Prefers separate administration due to previous adverse reaction.  - Administer flu vaccine.  - Consider COVID vaccine at a separate time to avoid confusion in case of adverse reactions.        Counseling with regards to preventative care given. Medication summary was discussed with the patient to verify compliance and understanding. Medication safety was discussed. A good faith effort was made to reconcile the patient's medications. The patient was given the opportunity to ask questions and those questions were answered  to the patient's satisfaction. The patient was encouraged to call with any additional questions or concerns. More than 50% of the visit was spent counseling and coordinating care.    Orders Placed This Encounter    Flu Vaccine, 65+,0.5 mL IM (Admin)    BASIC METABOLIC PANEL    CBC/DIFF    HEPATIC FUNCTION PANEL    HGA1C (HEMOGLOBIN A1C WITH EST AVG GLUCOSE)    LIPID PANEL    THYROID  STIMULATING HORMONE WITH FREE T4 REFLEX    traMADoL (ULTRAM) 50 mg Oral Tablet     Return in about 6 months (around 04/04/2025).  [facility], APRN, CNP 10/04/2024, 14:02  This note was created with assistance from [employer] via capture of conversational audio. Consent was obtained from the patient and all parties present prior to recording.       [1]   Allergies  Allergen Reactions    Cat Dander     Cockroach     Dog Dander     Grass Pollen     Mold     Tetanus Vaccines And Toxoid     Tree And Shrub Pollen

## 2024-10-04 NOTE — Patient Instructions (Signed)
 Medicare Preventive Services  Medicare coverage information Recommendation for YOU   Heart Disease and Diabetes   Lipid profile Every 5 years or more often if at risk for cardiovascular disease     Lab Results   Component Value Date    CHOLESTEROL 163 04/04/2024    HDLCHOL 72 04/04/2024    LDLCHOL 67 04/04/2024    TRIG 120 04/04/2024         Diabetes Screening    Yearly for those at risk for diabetes, 2 tests per year for those with prediabetes Last Glucose: 143    Diabetes Self Management Training or Medical Nutrition Therapy  For those with diabetes, up to 10 hrs initial training within a year, subsequent years up to 2 hrs of follow up training Optional for those with diabetes     Medical Nutrition Therapy  Three hours of one-on-one counseling in first year, two hours in subsequent years Optional for those with diabetes, kidney disease   Intensive Behavioral Therapy for Obesity  Face-to-face counseling, first month every week, month 2-6 every other week, month 7-12 every month if continued progress is documented Optional for those with Body Mass Index 30 or higher  Your Body mass index is 43.15 kg/m.   Tobacco Cessation (Quitting) Counseling   Covers up to 8 smoking and tobacco-use cessation counseling sessions in a 68-month period.    Optional for those that use tobacco   Cancer Screening Last Completion Date   Colorectal screening   For anyone age 19 to 76 or any age if high risk:  Screening Colonoscopy every 10 yrs if low risk,  more frequent if higher risk  OR  Cologuard Stool DNA test once every 3 years OR  Fecal Occult Blood Testing yearly OR  Flexible  Sigmoidoscopy  every 5 yr OR  CT Colonography every 5 yrs      See below for due date if applicable.   Screening Pap Test   Recommended every 3 years for all women age 31 to 7, or every five years if combined with HPV test (routine screening not needed after total hysterectomy).  Medicare covers every 2 years or yearly if high risk.  Screening Pelvic Exam    Medicare covers every 2 years, yearly if high risk or childbearing age with abnormal Pap in last 3 yrs.   --11/24/2023  See below for due date if applicable.   Screening Mammogram   Recommended every 2 years for women age 78 to 51, or more frequent if you have a higher risk. Selectively recommended for women between 40-49 based on shared decisions about risk. Covered by Medicare up to every year for women age 10 or older --04/22/2023  See below for due date if applicable.         Lung Cancer Screening  Annual low dose computed tomography (LDCT scan) is recommended for those age 8-80 who smoked 20 pack-years and are current smokers or quit smoking within past 15 years, after counseling by your doctor or nurse clinician about the possible benefits or harms.     See below for due date if applicable.   Vaccinations   Respiratory syncytial virus (RSV)  Age 66 years or older: Based on shared clinical decision-making with your provider.  Pneumococcal Vaccine  Recommended routinely age 1+ with one or two separate vaccines based on your risk. Recommended before age 84 if medical conditions with increased risk  Seasonal Influenza Vaccine  Once every flu season   Hepatitis B Vaccine  3 doses if risk (including anyone with diabetes or liver disease)  Shingles Vaccine  Two doses at age 75 or older  Diphtheria Tetanus Pertussis Vaccine  ONCE as adult, booster every 10 years     Immunization History   Administered Date(s) Administered   . COVID-19 VACCINE,MODERNA(SPIKEVAX),50MCG/0.5ML,12 YR+,SEASONAL 09/26/2022   . Covid-19 Vaccine,Moderna,12 Years+ 01/13/2020, 02/10/2020   . Influenza Vaccine, 6 month-adult 09/26/2022, 09/10/2023   . PREVNAR 13 10/24/2018     Shingles vaccine and Diphtheria Tetanus Pertussis vaccines are available at pharmacies or local health department without a prescription.   Other Preventative Screening  Last Completion Date   Bone Densitometry   Screening: All females ages 54 and older every 10 years if  initial screening normal. Postmenopausal women ages 58-64 need screening with one or more risk factor: previous fracture, parental hip fracture, current smoker, low body weight, excessive alcohol use, Rheumatoid Arthritis   For women with diagnosed Osteoporosis, follow up is recommended every 2 years or a frequency recommended by your provider.       See below for due date if applicable.     Glaucoma Screening   Yearly if in high risk group such as diabetes, family history, African American age 52+ or Hispanic American age 7+   See your eye care provider for screening.   Hepatitis C Screening   Recommended  for those born between ages 18-79 years.   --09/30/2023  See below for due date if applicable.     HIV Testing  Recommended routinely at least ONCE, covered every year for age 61 to 24 regardless of risk, and every year for age over 4 who ask for the test or higher risk. Yearly or up to 3 times in pregnancy         See below for due date if applicable.   Abdominal Aortic Aneurysm Screening Ultrasound   Once with a family history of abdominal aortic aneurysms OR a female between65-75 and have smoked at least 100 cigarettes in your lifetime.         See below for due date if applicable.       Your Personalized Schedule for Preventive Tests   Health Maintenance: Pending and Last Completed       Date Due Completion Date    Osteoporosis screening Never done ---    Colonoscopy Never done ---    Pneumococcal Vaccination, Age 73+ (2 of 2 - PPSV23, PCV20, or PCV21) 12/19/2018 10/24/2018    Breast Cancer Screening 04/21/2024 04/22/2023    Influenza Vaccine (1) 08/15/2024 09/10/2023    Covid-19 Vaccine (Shared decision making) (4 - 2025-26 season) 08/15/2024 09/26/2022    Shingles Vaccine (1 of 2) 04/04/2025 (Originally 03/04/2008) ---    RSV Adult 60+ or Pregnancy (1 - Risk 60-74 years 1-dose series) 04/04/2025 (Originally 03/04/2018) ---    Depression Screening 10/04/2025 10/04/2024    Medicare Annual Wellness Visit  10/04/2025 10/04/2024             Non-Opioid Treatment for Chronic Pains   Treatment for chronic pain can be managed without opioids. Below are non-opioid options that may be considered and discussed with your provider to determine which options would be best for your health.    Over the counter or presciptions medications:  Acetaminophen (Tylenol) or Non-steroidal anti-inflammatories such as: Ibuprofen (Motrin, Advil), naproxen (Aleve), aspirin  Antidepressants such as amitriptyline , nortriptyline (Pamelor),  Doxepin (Silenor), Imipramine (Tofranil) and others.  Anticonvulsant Nerve pain medications: Gabapentin (Neurontin), pregabalin (Lyrica)  Externally  applied medications such as NSAID'S, lidocaine, capsaisin, and others  Injections: pain specialists can sometimes inject medications at the site of pain.    Alternative therapies such as  Acupuncture  Osteopathic manipulation  Chiropractic  Massage therapy       For Information on Advanced Directives for Health Care:  [place]:  [email or web address]  [place], [place], [place], [place] General Information: [email or web address]

## 2024-10-04 NOTE — Progress Notes (Signed)
 FAMILY MEDICINE, MEDICAL OFFICE BUILDING  [address]  [place] [address]  Operated by [facility]  Medicare Annual Wellness Visit    Name: Heather Atkins MRN:  [id number]   Date: 10/04/2024 Age: 68 y.o.     History of Present Illness    Heather Atkins is a 68 year old female who presents for her annual Medicare wellness visit.    She is overdue for several vaccinations, including the pneumonia, shingles, and RSV vaccines. She has previously received the Prevnar 13 vaccine in 2019 and three COVID-19 vaccinations, two in 2021 and one in 2023. She typically receives an annual influenza vaccine but has not yet received one this year.    She has not completed a colonoscopy, bone density scan, or mammogram, which were previously ordered. She mentions insurance issues as a reason for the delay.    She has not had the shingles vaccine. Several of her siblings have had shingles, which influences her consideration for the vaccine.    She has no advanced care directives on file.    She is married, does not smoke, and consumes approximately four standard drinks of alcohol per week.          10/04/2024     1:09 PM   Comprehensive Health Assessment-Adult   Do you wish to complete this form? Yes   During the past 4 weeks, how would you rate your health in general? Good   During the past 4 weeks, how much difficulty have you had doing your usual activities inside and outside your home because of medical or emotional problems? Some difficulty   During the past 4 weeks, was someone available to help you if you needed and wanted help? Yes, as much as I wanted   In the past year, how many times have you gone to the emergency department or been admitted to a hospital for a health problem? 1 time   Are you generally satisfied with your sleep? Yes   Do you have enough money to buy things you need in everyday life, such as food, clothing, medicines, and housing? Yes, always   Can you get to places beyond walking  distance without help?  (For example, can you drive your own car or travel alone on buses)? Yes   Do you fasten your seatbelt when you are in a car? Yes, usually   Do you exercise 20 minutes 3 or more days per week (such as walking, dancing, biking, mowing grass, swimming)? Yes, some of the time   How often do you eat food that is healthy (fruits, vegetables, lean meats) instead of unhealthy (sweets, fast food, junk food, fatty foods)? Almost always   How often do you have trouble taking medicines the eay you are told to take them? I always take them as prescribed   Do you need any help communicating with your doctors and nurses because of vision or hearing problems? No   Do you have one person you think of as your personal doctor (primary care provider or family doctor)? Yes   If you are seeing a Primary Care Provider (PCP) or family doctor. please list their name [facility]   Are you now also seeing any specialist physician(s) (such as eye doctor, foot doctor, skin doctor)? Yes   If you are seeing a specialist for anything such as foot, eye, skin, etc.  please list their name(s) ortho, eye doctor,   How confident are you that you can  control or manage most of your health problems? Very confident     I have reviewed and updated as appropriate the past medical, family and social history. 10/04/2024 as summarized below:  Past Medical History:   Diagnosis Date    Allergic rhinitis     Asthma     COPD (chronic obstructive pulmonary disease)     Esophageal reflux     Essential hypertension     Migraine      Past Surgical History:   Procedure Laterality Date    Hx tonsillectomy      Sinus surgery       Current Outpatient Medications   Medication Sig    amitriptyline  (ELAVIL ) 50 mg Oral Tablet Take 1 Tablet (50 mg total) by mouth Every night    apixaban  (ELIQUIS ) 5 mg Oral Tablet Take 1 Tablet (5 mg total) by mouth Twice daily    atorvastatin  (LIPITOR) 20 mg Oral Tablet Take 1 Tablet (20 mg total) by mouth Daily     beclomethasone dipropionate (QVAR REDIHALER) 80 mcg/actuation Inhalation oral inhaler Take 1 Puff by inhalation Twice daily    Butalbital-Acetaminophen-Caff 50-300-40 mg Oral Capsule TAKE 1 CAPSULE BY MOUTH EVERY 6 HOURS AS NEEDED (Patient taking differently: Take 1 Capsule by mouth)    desloratadine  (CLARINEX ) 5 mg Oral Tablet Take 1 Tablet (5 mg total) by mouth Daily    ipratropium bromide  (ATROVENT ) 21 mcg (0.03 %) Nasal nasal spray Administer 2 Sprays into affected nostril(s) Twice per day as needed    losartan  (COZAAR ) 50 mg Oral Tablet Take 1 Tablet (50 mg total) by mouth Daily    montelukast  (SINGULAIR ) 10 mg Oral Tablet Take 1 Tablet (10 mg total) by mouth Daily    omeprazole  (PRILOSEC) 40 mg Oral Capsule, Delayed Release(E.C.) Take 1 Capsule (40 mg total) by mouth Once per day as needed for Other    pneumoc 20-valent conjugate vaccine (PREVNAR 20, PF,) IntraMUSCULAR Syringe Inject 0.5 mL into the muscle One time for 1 dose    RSVPreF3 antigen-AS01E, PF, (AREXVY, PF,) 120 mcg/0.5 mL IntraMUSCULAR Suspension for Reconstitution Inject 0.5 mL into the muscle One time for 1 dose    traMADoL (ULTRAM) 50 mg Oral Tablet Take 1 Tablet (50 mg total) by mouth Every 8 hours as needed for Pain    varicella-zoster, PF, 50 mcg/0.5 mL IntraMUSCULAR Suspension for Reconstitution Inject 0.5 mL into the muscle One time for 1 dose     Family Medical History:       Problem Relation (Age of Onset)    Asthma Other    Breast Cancer Paternal Aunt    Cancer Other    Hearing Loss Other    Migraines Other    Sleep apnea Other    Thyroid  Disease Other          Social History     Socioeconomic History    Marital status: Married   Tobacco Use    Smoking status: Never    Smokeless tobacco: Never   Substance and Sexual Activity    Alcohol use: Yes     Alcohol/week: 4.0 standard drinks of alcohol     Types: 4 Glasses of wine per week    Drug use: Not Currently     Social Determinants of Health     Health Literacy: High Risk (09/16/2024)     Health Literacy     SDOH Health Literacy: Always       List of Current Health Care Providers   Care Team  PCP       Name Type Specialty Phone Number    Silva Bart, APRN, CNP Nurse Practitioner FAMILY NURSE PRACTITIONER [phone]              Care Team       No care team found                      Health Maintenance   Topic Date Due    Osteoporosis screening  Never done    Colonoscopy  Never done    Pneumococcal Vaccination, Age 60+ (2 of 2 - PPSV23, PCV20, or PCV21) 12/19/2018    Breast Cancer Screening  04/21/2024    Influenza Vaccine (1) 08/15/2024    Covid-19 Vaccine (Shared decision making) (4 - 2025-26 season) 08/15/2024    Shingles Vaccine (1 of 2) 04/04/2025 (Originally 03/04/2008)    RSV Adult 60+ or Pregnancy (1 - Risk 60-74 years 1-dose series) 04/04/2025 (Originally 03/04/2018)    Depression Screening  10/04/2025    Medicare Annual Wellness Visit  10/04/2025    Hepatitis C screening  Completed    Adult Tdap-Td  Discontinued    Pap smear/HPV  Discontinued     Medicare Wellness Assessment   Medicare initial or wellness physical in the last year?: Yes  Advance Directives   Does patient have a living will or MPOA: No                  Activities of Daily Living   Do you need help with dressing, bathing, or walking?: No   Do you need help with shopping, housekeeping, medications, or finances?: No   Do you have rugs in hallways, broken steps, or poor lighting?: No       Cognitive Function Screen (1=Yes, 0=No)   What is you age?: Correct   What is the time to the nearest hour?: Correct   What is the year?: Correct   What is the name of this clinic?: Correct   Can the patient recognize two persons (the doctor, the nurse, home help, etc.)?: Correct   What is the date of your birth? (day and month sufficient) : Correct   In what year did World War II end?: Correct   Who is the current president of the [place] ?: Correct   Count from 20 down to 1?: Correct   What address did I give you earlier?:  Correct   Total Score: 10   Interpretation of Total Score: Greater than 6 Normal   Fall Risk Screen   Do you feel unsteady when standing or walking?: No  Do you worry about falling?: No  Have you fallen in the past year?: Yes  How many times have you fallen?: 2 or more times  Were you ever injured from falling?: Yes   Depression Screen     Little interest or pleasure in doing things.: Not at all  Feeling down, depressed, or hopeless: Not at all  PHQ 2 Total: 0     Pain Score   Pain Score:  10 - Worst pain ever    Substance Use-Abuse Screening     Tobacco Use     In Past 12 MONTHS, how often have you used any tobacco product (for example, cigarettes, e-cigarettes, cigars, pipes, or smokeless tobacco)?: Never     Alcohol use     In the PAST 12 MONTHS, how often have you had 5 (men)/4 (women) or more drinks containing alcohol in one day?:  Less Than Monthly  In the PAST 3 MONTHS, did you have a drink containing alcohol?: No  In the PAST 3 MONTHS, have you tried and failed to control, cut down or stop drinking?: No     Prescription Drug Use     In the PAST 12 months, how often have you used any prescription medications just for the feeling, more than prescribed, or that were not prescribed for you? Prescriptions may include: opioids, benzodiazepines, medications for ADHD: Never           Illicit Drug Use   In the PAST 12 MONTHS, how often have you used any drugs, including marijuana, cocaine or crack, heroin, methamphetamine, hallucinogens, ecstasy/MDMA?: Never        Hearing Screen   Have you noticed any hearing difficulties?: Yes  After whispering 9-1-6 how many numbers did the patient repeat correctly?: 3     Urine Incontinence Screen   Urinary Incontinence Screen  Do you ever leak urine when you don't want to?: YES     Allergies[1]    OBJECTIVE:   BP 106/86   Pulse 91   Temp 36.4 C (97.5 F) (Temporal)   Resp 18   Ht 1.575 m (5' 2)   Wt 107 kg (235 lb 14.3 oz)   SpO2 93%   BMI 43.15 kg/m     Physical Exam     CONSTITUTIONAL: Patient is not in acute distress. Normal appearance.  HENT: Mucous membranes are moist. Pupils are equal, round, and reactive to light.  CARDIOVASCULAR: Normal rate and regular rhythm. Normal pulses. Normal heart sounds. No murmur.  PULMONARY: Pulmonary effort is normal. Lungs clear to auscultation bilaterally.  MUSCULOSKELETAL: No swelling or deformity. No tenderness.  SKIN: Skin is warm and dry.  NEUROLOGICAL: No focal deficit present. Alert and oriented to person, place, and time.  PSYCHIATRIC: Mood and affect normal.       Health Maintenance Due   Topic Date Due    Osteoporosis screening  Never done    Colonoscopy  Never done    Pneumococcal Vaccination, Age 68+ (2 of 2 - PPSV23, PCV20, or PCV21) 12/19/2018    Breast Cancer Screening  04/21/2024    Influenza Vaccine (1) 08/15/2024    Covid-19 Vaccine (Shared decision making) (4 - 2025-26 season) 08/15/2024      Assessment & Plan     Adult wellness visit  Annual Medicare wellness visit. Overdue for colonoscopy, mammogram, and DEXA scan. Discussed importance of screenings, CDC vaccine recommendations, and advanced care directives.  - Administer influenza vaccine today.  - Send orders for RSV, shingles, and pneumonia [facility] to [facility] [place].  - Reschedule DEXA scan, mammogram, and colonoscopy.  - Provide information on advanced directives.       Assessment/Plan   1. Medicare annual wellness visit, subsequent    2. Advanced care planning/counseling discussion    3. Encounter for screening mammogram for malignant neoplasm of breast    4. Colon cancer screening    5. Need for shingles vaccine    6. Osteoporosis screening    7. Postmenopausal status    8. Need for RSV immunization    9. Need for pneumococcal 20-valent conjugate vaccination       Identified Risk Factors/ Recommended Actions   Fall Risk Follow up plan of care: Discussed optimizing home safety    The PHQ 2 Total: 0 depression screen is interpreted as negative.    Urinary Incontinence  Plan of Care: Behavioral interventions with bladder  training, pelvic floor muscle training, and/or prompted voiding    Opioid use plan of care:         Opioids use: Plan: Assessment of pain completed and pain controlled    Advanced Directives: Patient agreeable to - Advanced Directives discussed and patient elected to take home to review.    Orders Placed This Encounter    MAMMO BILATERAL SCREENING    DEXA BONE DENSITOMETRY    Referral to GENERAL SURGERY - [place] - DUREMDES, MULLINS, HOPKINS    pneumoc 20-valent conjugate vaccine (PREVNAR 20, PF,) IntraMUSCULAR Syringe    varicella-zoster, PF, 50 mcg/0.5 mL IntraMUSCULAR Suspension for Reconstitution    RSVPreF3 antigen-AS01E, PF, (AREXVY, PF,) 120 mcg/0.5 mL IntraMUSCULAR Suspension for Reconstitution      The patient has been educated about risk factors and recommended preventive care. Written Prevention Plan completed/ updated and given to patient (see After Visit Summary).    Return in 1 year (on 10/04/2025) for Medicare Well Visit.  [facility], APRN, CNP  This note was created with assistance from [employer] via capture of conversational audio. Consent was obtained from the patient and all parties present prior to recording.       [1]   Allergies  Allergen Reactions    Cat Dander     Cockroach     Dog Dander     Grass Pollen     Mold     Tetanus Vaccines And Toxoid     Tree And Shrub Pollen

## 2024-10-04 NOTE — Nursing Note (Signed)
 10/04/24 1312   Medicare Wellness Assessment   Medicare initial or wellness physical in the last year? Yes   Advance Directives   Does patient have a living will or MPOA No   Activities of Daily Living   Do you need help with dressing, bathing, or walking? No   Do you need help with shopping, housekeeping, medications, or finances? No   Do you have rugs in hallways, broken steps, or poor lighting? No   Cognitive Function Screen   What is you age? 1   What is the time to the nearest hour? 1   Remember this address: [address]   What is the year? 1   What is the name of this clinic? 1   Can the patient recognize two persons (the doctor, the nurse, home help, etc.)? 1   What is the date of your birth? (day and month sufficient)  1   In what year did World War II end? 1   Who is the current president of the [place] ? 1   Count from 20 down to 1? 1   What address did I give you earlier? 1   Total Score 10   Interpretation of Total Score Greater than 6 Normal   Depression Screen   Little interest or pleasure in doing things. 0   Feeling down, depressed, or hopeless 0   PHQ 2 Total 0   Pain Score   Pain Score TEN   Substance Use Screening   In Past 12 MONTHS, how often have you used any tobacco product (for example, cigarettes, e-cigarettes, cigars, pipes, or smokeless tobacco)? Never   In the PAST 12 MONTHS, how often have you had 5 (men)/4 (women) or more drinks containing alcohol in one day? Less Than Monthly   In the PAST 3 MONTHS, did you have a drink containing alcohol? No   In the PAST 3 MONTHS, have you tried and failed to control, cut down or stop drinking? No   In the PAST 12 months, how often have you used any prescription medications just for the feeling, more than prescribed, or that were not prescribed for you? Prescriptions may include: opioids, benzodiazepines, medications for ADHD Never   In the PAST 12 MONTHS, how often have you used any drugs, including marijuana, cocaine or  crack, heroin, methamphetamine, hallucinogens, ecstasy/MDMA? Never   Hearing Screen   Have you noticed any hearing difficulties? Yes   After whispering 9-1-6 how many numbers did the patient repeat correctly? 3   Fall Risk Assessment   Do you feel unsteady when standing or walking? No   Do you worry about falling? No   Have you fallen in the past year? Yes   How many times have you fallen? 2 or more times   Were you ever injured from falling? Yes   Urinary Incontinence Screen   Do you ever leak urine when you don't want to? YES

## 2024-10-04 NOTE — Nursing Note (Signed)
 10/04/24 1308   Domestic Violence   Because we are aware of abuse and domestic violence today, we ask all patients: Are you being hurt, hit, or frightened by anyone at your home or in your life?  N   Basic Needs   Do you have any basic needs within your home that are not being met? (such as Food, Shelter, [employer] fast streamer, Tranportation, paying for bills and/or medications) N

## 2024-10-04 NOTE — Nursing Note (Signed)
 10/04/24 1309   Comprehensive Health Assessment-Adult   Do you wish to complete this form? Yes   During the past 4 weeks, how would you rate your health in general? Good   During the past 4 weeks, how much difficulty have you had doing your usual activities inside and outside your home because of medical or emotional problems? Some difficulty   During the past 4 weeks, was someone available to help you if you needed and wanted help? Yes, as much as I wanted   In the past year, how many times have you gone to the emergency department or been admitted to a hospital for a health problem? 1 time   Are you generally satisfied with your sleep? Yes   Do you have enough money to buy things you need in everyday life, such as food, clothing, medicines, and housing? Yes, always   Can you get to places beyond walking distance without help?  (For example, can you drive your own car or travel alone on buses)? Yes   Do you fasten your seatbelt when you are in a car? Yes, usually   Do you exercise 20 minutes 3 or more days per week (such as walking, dancing, biking, mowing grass, swimming)? Yes, some of the time   How often do you eat food that is healthy (fruits, vegetables, lean meats) instead of unhealthy (sweets, fast food, junk food, fatty foods)? Almost always   How often do you have trouble taking medicines the eay you are told to take them? I always take them as prescribed   Do you need any help communicating with your doctors and nurses because of vision or hearing problems? No   Do you have one person you think of as your personal doctor (primary care provider or family doctor)? Yes   If you are seeing a Primary Care Provider (PCP) or family doctor. please list their name [facility]   Are you now also seeing any specialist physician(s) (such as eye doctor, foot doctor, skin doctor)? Yes   If you are seeing a specialist for anything such as foot, eye, skin, etc.  please list their name(s) ortho, eye doctor,   How  confident are you that you can control or manage most of your health problems? Very confident

## 2024-10-05 ENCOUNTER — Ambulatory Visit (INDEPENDENT_AMBULATORY_CARE_PROVIDER_SITE_OTHER): Payer: Self-pay

## 2024-10-05 ENCOUNTER — Encounter (INDEPENDENT_AMBULATORY_CARE_PROVIDER_SITE_OTHER): Payer: Self-pay

## 2024-10-05 LAB — HGA1C (HEMOGLOBIN A1C WITH EST AVG GLUCOSE): HEMOGLOBIN A1C: 6.3 % — ABNORMAL HIGH (ref 4.0–6.0)

## 2024-10-12 ENCOUNTER — Ambulatory Visit (INDEPENDENT_AMBULATORY_CARE_PROVIDER_SITE_OTHER): Payer: Self-pay | Admitting: Surgery

## 2024-10-12 ENCOUNTER — Ambulatory Visit (INDEPENDENT_AMBULATORY_CARE_PROVIDER_SITE_OTHER): Payer: Self-pay

## 2024-10-17 ENCOUNTER — Other Ambulatory Visit: Payer: Self-pay

## 2024-10-17 ENCOUNTER — Encounter (INDEPENDENT_AMBULATORY_CARE_PROVIDER_SITE_OTHER): Payer: Self-pay

## 2024-10-17 ENCOUNTER — Ambulatory Visit: Payer: Self-pay | Attending: Internal Medicine

## 2024-10-17 ENCOUNTER — Ambulatory Visit (HOSPITAL_BASED_OUTPATIENT_CLINIC_OR_DEPARTMENT_OTHER)
Admission: RE | Admit: 2024-10-17 | Discharge: 2024-10-17 | Disposition: A | Discharge status: Home / Self Care | Source: Ambulatory Visit

## 2024-10-17 VITALS — BP 127/81 | HR 99 | Temp 97.4°F | Resp 18 | Wt 235.3 lb

## 2024-10-17 DIAGNOSIS — Z86718 Personal history of other venous thrombosis and embolism: Secondary | ICD-10-CM | POA: Insufficient documentation

## 2024-10-17 DIAGNOSIS — G8929 Other chronic pain: Secondary | ICD-10-CM

## 2024-10-17 DIAGNOSIS — M25561 Pain in right knee: Secondary | ICD-10-CM

## 2024-10-17 MED ORDER — TRAMADOL 50 MG TABLET
1.0000 | ORAL_TABLET | Freq: Three times a day (TID) | ORAL | 0 refills | Status: DC | PRN
Start: 2024-10-17 — End: 2024-11-03

## 2024-10-17 NOTE — Nursing Note (Signed)
 10/17/24 1017   Domestic Violence   Because we are aware of abuse and domestic violence today, we ask all patients: Are you being hurt, hit, or frightened by anyone at your home or in your life?  N   Basic Needs   Do you have any basic needs within your home that are not being met? (such as Food, Shelter, [employer] Fast Streamer, Tranportation, paying for bills and/or medications) N

## 2024-10-17 NOTE — Progress Notes (Signed)
 FAMILY MEDICINE, MEDICAL OFFICE BUILDING  [address]  [place] [address]  Operated by [facility]    SILVIA HIGHTOWER  1955/12/30  [id number]    Date of Service: 10/17/2024 10:15 AM EST  Chief complaint:   Chief Complaint   Patient presents with    Knee Pain     Subjective:   History of Present Illness    Heather Atkins is a 68 year old female with a history of meniscal tear and DVT who presents with persistent knee pain.    She presents with persistent knee pain following a fall on her right knee on September 06, 2024. Initially, the pain was located in the posterior aspect of the knee, which led to a positive DVT study and an ER visit. She was started on Eliquis  with a titration protocol beginning at 10 mg twice daily for seven days, followed by 5 mg twice daily ongoing.    The pain, initially located in the calf and behind the knee, has moved to the front of the leg and is severe enough to cause tears. 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 Imaging studies, including x-rays, MRI, and ultrasound, have been performed. An MRI on September 09, 2024, showed a signal in the posterior horn of the medial meniscus and findings of a grade one sprain of the MCL. She also has osteoarthritis.    Despite this, she continues to experience significant pain, which she describes as 'a lot of pain.' She is currently taking tramadol every eight hours but finds herself needing it sooner than scheduled. She is concerned about the potential for addiction and is considering adjusting her medication regimen to take two doses during the day and Tylenol PM at night.    She has been on Eliquis  for almost a month and a half. The pain is now primarily anterior and not associated with the blood clot symptoms she initially experienced. She is concerned about the ongoing pain and its impact on her life, stating, 'I can't keep going around like this. I mean, my life is nothing right now.'    She  has been elevating her legs at night to manage swelling and reports that her ankles appear okay. She has not been traveling extensively due to the initial advice given when starting Eliquis . She has grandchildren in [place] but has not been traveling due to her condition and the advice against extensive travel while on Eliquis .        Review of Systems:  Any pertinent Review of Systems as addressed in the HPI above.    Objective   BP 127/81   Pulse 99   Temp 36.3 C (97.4 F) (Temporal)   Resp 18   Wt 107 kg (235 lb 4.8 oz)   SpO2 95%   BMI 43.04 kg/m   Physical Exam    CONSTITUTIONAL: Patient is not in acute distress. Normal appearance.  HENT: Mucous membranes are moist. Pupils are equal, round, and reactive to light.  MUSCULOSKELETAL: No swelling or deformity. No tenderness.      ICD-10-CM    1. 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 Pain management with tramadol.  - Administer steroid injection to the right knee.  - Continue tramadol  with two daytime doses and Tylenol PM at night.  - Educated on low addiction risk of tramadol.  - Encourage ice and Tylenol for additional pain relief.    Meniscal Tear  Medial meniscus tear exacerbated post-fall. Orthopedic intervention delayed until DVT resolution.  - Coordinate with orthopedics for potential intervention post-DVT resolution.    Deep Vein Thrombosis (DVT)  Confirmed DVT in right leg post-fall. On Eliquis , awaiting clot resolution for orthopedic intervention.  - Order venous duplex ultrasound to assess current DVT status.  - Continue Eliquis  as per current protocol.    General Health Maintenance  Concern about long travel due to DVT, managing swelling with leg elevation.  - Advise on proper leg elevation techniques to manage swelling.    Follow-up  Requires follow-up for knee pain and DVT  management. Venous duplex ultrasound results will guide further orthopedic management.  - Schedule follow-up appointment in April or as needed based on test results.  - Ensure she receives venous duplex ultrasound results and coordinates with orthopedics for further management.        The patient was given the opportunity to ask questions and those questions were answered to the patient's satisfaction. The patient was encouraged to call with any additional questions or concerns. I instructed the patient to follow-up if symptoms persist or worsen. Medication safety was discussed. A good faith effort was made to reconcile the patient's medications. More than 50% of the visit was spent counseling and coordinating care.    Orders Placed This Encounter    PERIPHERAL VENOUS DUPLEX - LOWER    traMADoL (ULTRAM) 50 mg Oral Tablet     Follow up: Return if symptoms worsen or fail to improve.  [facility], APRN, CNP   This note was created with assistance from [employer] via capture of conversational audio. Consent was obtained from the patient and all parties present prior to recording.

## 2024-10-18 ENCOUNTER — Ambulatory Visit (INDEPENDENT_AMBULATORY_CARE_PROVIDER_SITE_OTHER): Payer: Self-pay

## 2024-10-18 ENCOUNTER — Encounter (INDEPENDENT_AMBULATORY_CARE_PROVIDER_SITE_OTHER): Payer: Self-pay

## 2024-10-18 DIAGNOSIS — Z86718 Personal history of other venous thrombosis and embolism: Secondary | ICD-10-CM

## 2024-10-18 DIAGNOSIS — I82409 Acute embolism and thrombosis of unspecified deep veins of unspecified lower extremity: Secondary | ICD-10-CM | POA: Insufficient documentation

## 2024-10-19 ENCOUNTER — Encounter (INDEPENDENT_AMBULATORY_CARE_PROVIDER_SITE_OTHER): Payer: Self-pay

## 2024-10-19 DIAGNOSIS — I749 Embolism and thrombosis of unspecified artery: Secondary | ICD-10-CM | POA: Insufficient documentation

## 2024-10-21 ENCOUNTER — Telehealth (INDEPENDENT_AMBULATORY_CARE_PROVIDER_SITE_OTHER): Payer: Self-pay

## 2024-10-21 NOTE — Nursing Note (Signed)
 Received call from patient stating she sees provider for blood clots and she had gotten an appt for 6 months. States doesn't she need to follow up sooner than every 6 months for such a thing and if so how often? States she will also be having surgery coming up and when she does how long will she need to be off her Eliquis ? And when does she need to make a sooner appt for?

## 2024-10-21 NOTE — Telephone Encounter (Signed)
 Patient informed. Voices understanding and agreement.

## 2024-10-27 ENCOUNTER — Other Ambulatory Visit (INDEPENDENT_AMBULATORY_CARE_PROVIDER_SITE_OTHER): Payer: Self-pay

## 2024-10-27 MED ORDER — TRAMADOL 37.5 MG-ACETAMINOPHEN 325 MG TABLET
1.0000 | ORAL_TABLET | Freq: Three times a day (TID) | ORAL | 0 refills | Status: DC | PRN
Start: 2024-10-27 — End: 2024-11-03

## 2024-10-27 NOTE — Telephone Encounter (Signed)
 Provider is out of the office today.

## 2024-11-02 ENCOUNTER — Ambulatory Visit: Attending: Internal Medicine | Admitting: PHYSICIAN ASSISTANT

## 2024-11-02 ENCOUNTER — Other Ambulatory Visit: Payer: Self-pay

## 2024-11-02 ENCOUNTER — Encounter (INDEPENDENT_AMBULATORY_CARE_PROVIDER_SITE_OTHER): Payer: Self-pay | Admitting: PHYSICIAN ASSISTANT

## 2024-11-02 VITALS — BP 124/62 | HR 97 | Temp 98.1°F | Ht 62.0 in | Wt 238.8 lb

## 2024-11-02 DIAGNOSIS — M79604 Pain in right leg: Secondary | ICD-10-CM | POA: Insufficient documentation

## 2024-11-02 DIAGNOSIS — Z7901 Long term (current) use of anticoagulants: Secondary | ICD-10-CM

## 2024-11-02 DIAGNOSIS — M25561 Pain in right knee: Secondary | ICD-10-CM | POA: Insufficient documentation

## 2024-11-02 DIAGNOSIS — Z86718 Personal history of other venous thrombosis and embolism: Secondary | ICD-10-CM

## 2024-11-02 DIAGNOSIS — R5383 Other fatigue: Secondary | ICD-10-CM | POA: Insufficient documentation

## 2024-11-02 NOTE — Nursing Note (Signed)
 11/02/24 1655   Domestic Violence   Because we are aware of abuse and domestic violence today, we ask all patients: Are you being hurt, hit, or frightened by anyone at your home or in your life?  N   Basic Needs   Do you have any basic needs within your home that are not being met? (such as Food, Shelter, [employer], Tranportation, paying for bills and/or medications) N

## 2024-11-02 NOTE — Nursing Note (Signed)
 11/02/24 1655   Fall Risk Assessment   Do you feel unsteady when standing or walking? Yes   Do you worry about falling? No   Have you fallen in the past year? Yes   How many times have you fallen? Once   Were you ever injured from falling? Yes

## 2024-11-03 ENCOUNTER — Other Ambulatory Visit (INDEPENDENT_AMBULATORY_CARE_PROVIDER_SITE_OTHER): Payer: Self-pay

## 2024-11-03 ENCOUNTER — Ambulatory Visit
Admission: RE | Admit: 2024-11-03 | Discharge: 2024-11-03 | Disposition: A | Discharge status: Home / Self Care | Source: Ambulatory Visit | Attending: PHYSICIAN ASSISTANT | Admitting: PHYSICIAN ASSISTANT

## 2024-11-03 DIAGNOSIS — M79604 Pain in right leg: Secondary | ICD-10-CM

## 2024-11-03 DIAGNOSIS — I824Z9 Acute embolism and thrombosis of unspecified deep veins of unspecified distal lower extremity: Secondary | ICD-10-CM

## 2024-11-03 DIAGNOSIS — M25561 Pain in right knee: Secondary | ICD-10-CM

## 2024-11-03 DIAGNOSIS — Z86718 Personal history of other venous thrombosis and embolism: Secondary | ICD-10-CM

## 2024-11-03 MED ORDER — TRAMADOL 37.5 MG-ACETAMINOPHEN 325 MG TABLET: 1.0000 | ORAL_TABLET | Freq: Three times a day (TID) | ORAL | 0 refills | Status: DC | PRN

## 2024-11-03 NOTE — Telephone Encounter (Signed)
 RX approved and encounter closed

## 2024-11-03 NOTE — Progress Notes (Signed)
 [facility] OFF[facility]  [address]  [place] [address]  Operated by [facility]    Progress Note    Heather Atkins  1956-01-03  [id number]    Date of Service: 11/02/2024  4:45 PM EST    Chief complaint:   Chief Complaint   Patient presents with    Leg Swelling     Pt c/o right leg pain and bilateral swelling on legs.        Subjective:     This is a case of a 68 y.o. year old female who comes in today for acute evaluation. Pt.c/o worsening of right leg pain for couple weeks and radiating into right popliteal area. No falls or injuries. Pt.dx with right DVT in 08/2024 in addition to knee pain from right meniscus issues. Pt.states she is taking her Eliquis  has directed. She is more sedentary due to leg pain and sob, decreased endurance. No travel no falls no med changes. Fatigue increased.      Review of Systems   Constitutional:  Positive for activity change and fatigue. Negative for chills and fever.   Respiratory:  Positive for shortness of breath. Negative for cough and chest tightness.    Cardiovascular:  Positive for leg swelling. Negative for chest pain and palpitations.   Gastrointestinal: Negative.    Neurological:  Positive for headaches.   Psychiatric/Behavioral:  Positive for sleep disturbance (due to recent leg pain).       All systems reviewed and negative except for what is noted above.         Current Outpatient Medications   Medication Sig    amitriptyline  (ELAVIL ) 50 mg Oral Tablet Take 1 Tablet (50 mg total) by mouth Every night    apixaban  (ELIQUIS ) 5 mg Oral Tablet Take 1 Tablet (5 mg total) by mouth Twice daily    atorvastatin  (LIPITOR) 20 mg Oral Tablet Take 1 Tablet (20 mg total) by mouth Daily    beclomethasone dipropionate (QVAR REDIHALER) 80 mcg/actuation Inhalation oral inhaler Take 1 Puff by inhalation Twice daily    Butalbital-Acetaminophen -Caff 50-300-40 mg Oral Capsule TAKE 1 CAPSULE BY MOUTH EVERY 6 HOURS AS NEEDED (Patient taking differently:  Take 1 Capsule by mouth)    desloratadine  (CLARINEX ) 5 mg Oral Tablet Take 1 Tablet (5 mg total) by mouth Daily    ipratropium bromide  (ATROVENT ) 21 mcg (0.03 %) Nasal nasal spray Administer 2 Sprays into affected nostril(s) Twice per day as needed    losartan  (COZAAR ) 50 mg Oral Tablet Take 1 Tablet (50 mg total) by mouth Daily    montelukast  (SINGULAIR ) 10 mg Oral Tablet Take 1 Tablet (10 mg total) by mouth Daily    omeprazole  (PRILOSEC) 40 mg Oral Capsule, Delayed Release(E.C.) Take 1 Capsule (40 mg total) by mouth Once per day as needed for Other    traMADoL  (ULTRAM ) 50 mg Oral Tablet Take 1 Tablet (50 mg total) by mouth Every 8 hours as needed for Pain    traMADol -acetaminophen  (ULTRACET ) 37.5-325 mg Oral Tablet Take 1 Tablet by mouth Every 8 hours as needed       Objective:     BP 124/62   Pulse 97   Temp 36.7 C (98.1 F) (Temporal)   Ht 1.575 m (5' 2)   Wt 108 kg (238 lb 12.8 oz)   SpO2 96%   BMI 43.68 kg/m      Physical Exam  Vitals and nursing note reviewed.   Constitutional:  General: She is not in acute distress.     Appearance: Normal appearance. She is obese. She is not ill-appearing.      Comments: Pt.appears tired, NAD   Cardiovascular:      Rate and Rhythm: Normal rate and regular rhythm.      Heart sounds: Normal heart sounds.   Pulmonary:      Effort: Pulmonary effort is normal.      Breath sounds: Normal breath sounds.   Musculoskeletal:         General: Swelling, tenderness and deformity present.      Cervical back: Normal range of motion and neck supple.      Right lower leg: Edema present.      Left lower leg: Edema present.      Comments: Rt.lower extremity edematous 2+ and tender from lower extremity into popliteal fossa and calf tenderness with mild palpation, and positive Homans test. Left lower extremity with mild edema, no tenderness or warmth. Cane used for ambulation.   Neurological:      Mental Status: She is alert.           Assessment & Plan  History of DVT (deep vein  thrombosis)    Orders:    Referral to External Provider    PERIPHERAL VENOUS DUPLEX - LOWER; Future    Acute leg pain, right    Orders:    PERIPHERAL VENOUS DUPLEX - LOWER; Future    Fatigue         Right knee pain    Orders:    PERIPHERAL VENOUS DUPLEX - LOWER; Future                  The patient was given the opportunity to ask questions and those questions were answered to the patient's satisfaction. The patient was encouraged to call with any additional questions or concerns.    Follow up: Return in about 4 weeks (around 11/30/2024), or 30 minute.    Gerardine Ammon, PA-C

## 2024-11-04 ENCOUNTER — Ambulatory Visit (HOSPITAL_COMMUNITY): Payer: Self-pay

## 2024-11-14 ENCOUNTER — Telehealth (INDEPENDENT_AMBULATORY_CARE_PROVIDER_SITE_OTHER): Payer: Self-pay | Admitting: PHYSICIAN ASSISTANT

## 2024-11-14 NOTE — Telephone Encounter (Signed)
 Pt called and asked if you had her moved over to you as her PCP. Pt said she needs a fluid pill she still has swollen. Pt asked for her ultracet  refilled also.

## 2024-11-15 ENCOUNTER — Telehealth (INDEPENDENT_AMBULATORY_CARE_PROVIDER_SITE_OTHER): Payer: Self-pay

## 2024-11-15 MED ORDER — TRAMADOL 37.5 MG-ACETAMINOPHEN 325 MG TABLET: 1.0000 | ORAL_TABLET | Freq: Three times a day (TID) | ORAL | 0 refills | Status: DC | PRN

## 2024-11-15 NOTE — Telephone Encounter (Signed)
 Pt says her tramadol  needs a PA on it.

## 2024-11-15 NOTE — Telephone Encounter (Signed)
 Pt called and asked if you could refill her tramadol -acetaminophen . Says she will be out after today.

## 2024-11-17 ENCOUNTER — Ambulatory Visit (INDEPENDENT_AMBULATORY_CARE_PROVIDER_SITE_OTHER): Payer: Self-pay | Admitting: PHYSICIAN ASSISTANT

## 2024-11-24 ENCOUNTER — Ambulatory Visit (HOSPITAL_COMMUNITY)

## 2024-11-24 ENCOUNTER — Other Ambulatory Visit: Payer: Self-pay

## 2024-11-24 ENCOUNTER — Ambulatory Visit
Admission: RE | Admit: 2024-11-24 | Discharge: 2024-11-24 | Disposition: A | Discharge status: Home / Self Care | Source: Ambulatory Visit | Attending: Orthopaedic Surgery | Admitting: Orthopaedic Surgery

## 2024-11-24 ENCOUNTER — Other Ambulatory Visit (HOSPITAL_COMMUNITY): Payer: Self-pay | Admitting: Orthopaedic Surgery

## 2024-11-24 ENCOUNTER — Other Ambulatory Visit (INDEPENDENT_AMBULATORY_CARE_PROVIDER_SITE_OTHER): Payer: Self-pay

## 2024-11-24 DIAGNOSIS — Z01818 Encounter for other preprocedural examination: Secondary | ICD-10-CM

## 2024-11-24 LAB — CBC
HCT: 41.2 % (ref 31.2–41.9)
HGB: 14.3 g/dL (ref 10.9–14.3)
MCH: 32.2 pg (ref 24.7–32.8)
MCHC: 34.7 g/dL (ref 32.3–35.6)
MCV: 92.8 fL (ref 75.5–95.3)
MPV: 8.3 fL (ref 7.9–10.8)
PLATELETS: 301 x10ˆ3/uL (ref 140–440)
RBC: 4.44 x10ˆ6/uL (ref 3.63–4.92)
RDW: 12.9 % (ref 12.3–17.7)
WBC: 6 x10ˆ3/uL (ref 3.8–11.8)

## 2024-11-24 LAB — BASIC METABOLIC PANEL
ANION GAP: 6 mmol/L (ref 4–13)
BUN/CREA RATIO: 15 (ref 6–22)
BUN: 15 mg/dL (ref 7–25)
CALCIUM: 9.1 mg/dL (ref 8.6–10.3)
CHLORIDE: 107 mmol/L (ref 98–107)
CO2 TOTAL: 26 mmol/L (ref 21–31)
CREATININE: 1.02 mg/dL (ref 0.60–1.30)
ESTIMATED GFR: 61 mL/min/1.73mˆ2 (ref >59)
GLUCOSE: 120 mg/dL — ABNORMAL HIGH (ref 74–109)
OSMOLALITY, CALCULATED: 280 mosm/kg (ref 270–290)
POTASSIUM: 4.2 mmol/L (ref 3.5–5.1)
SODIUM: 139 mmol/L (ref 136–145)

## 2024-11-24 LAB — URINALYSIS, MACROSCOPIC
BILIRUBIN: NEGATIVE mg/dL
BLOOD: NEGATIVE mg/dL
GLUCOSE: NEGATIVE mg/dL
KETONES: NEGATIVE mg/dL
LEUKOCYTES: NEGATIVE WBCs/uL
NITRITE: NEGATIVE
PH: 5.5 (ref 5.0–9.0)
PROTEIN: NEGATIVE mg/dL
SPECIFIC GRAVITY: 1.011 (ref 1.002–1.030)
UROBILINOGEN: NORMAL mg/dL

## 2024-11-24 LAB — URINALYSIS, MICROSCOPIC
RBCS: 1 /HPF (ref <4)
SQUAMOUS EPITHELIAL: 1 /HPF (ref <28)
WBCS: <1 /HPF (ref <6)

## 2024-11-25 DIAGNOSIS — Z01818 Encounter for other preprocedural examination: Secondary | ICD-10-CM

## 2024-11-25 LAB — ECG 12 LEAD
Atrial Rate: 93 {beats}/min
Calculated P Axis: 68 degrees
Calculated R Axis: 64 degrees
Calculated T Axis: 51 degrees
PR Interval: 160 ms
QRS Duration: 76 ms
QT Interval: 356 ms
QTC Calculation: 442 ms
Ventricular rate: 93 {beats}/min

## 2024-11-28 ENCOUNTER — Ambulatory Visit (INDEPENDENT_AMBULATORY_CARE_PROVIDER_SITE_OTHER): Payer: Self-pay

## 2024-11-29 ENCOUNTER — Encounter (INDEPENDENT_AMBULATORY_CARE_PROVIDER_SITE_OTHER): Payer: Self-pay

## 2024-11-29 ENCOUNTER — Other Ambulatory Visit: Payer: Self-pay

## 2024-11-29 ENCOUNTER — Ambulatory Visit: Payer: Self-pay

## 2024-11-29 VITALS — BP 108/68 | HR 102 | Temp 98.0°F | Resp 18 | Wt 238.6 lb

## 2024-11-29 DIAGNOSIS — H6090 Unspecified otitis externa, unspecified ear: Secondary | ICD-10-CM

## 2024-11-29 DIAGNOSIS — M25561 Pain in right knee: Secondary | ICD-10-CM | POA: Insufficient documentation

## 2024-11-29 DIAGNOSIS — Z01818 Encounter for other preprocedural examination: Secondary | ICD-10-CM | POA: Insufficient documentation

## 2024-11-29 MED ORDER — ACETIC ACID 2 % EAR SOLUTION: 5.0000 [drp] | Freq: Four times a day (QID) | OTIC | 0 refills | Status: DC

## 2024-11-29 NOTE — Nursing Note (Signed)
 11/29/24 1413   Domestic Violence   Because we are aware of abuse and domestic violence today, we ask all patients: Are you being hurt, hit, or frightened by anyone at your home or in your life?  N   Basic Needs   Do you have any basic needs within your home that are not being met? (such as Food, Shelter, [employer], Tranportation, paying for bills and/or medications) N

## 2024-11-29 NOTE — Progress Notes (Signed)
 OUTPATIENT PROGRESS NOTE    Subjective:   Patient ID:  Heather Atkins is a pleasant 68 y.o. female.    Chief Complaint: Pre-op (Pt states she needs jury duty papers filled out /Pt also states she feels like there fluid in the right ear /Pt also wants to discuss medication )    Requesting MD: Dr. 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    She is scheduled for right knee arthroscopy on January 04, 2025. She has a history of right knee osteoarthritis and a medial meniscal tear. She has experienced minimal improvement with two corticosteroid injections and NSAIDs. An MRI has documented early arthritis and a medial meniscal tear.    She takes Eliquis , which will need to be adjusted around the time of surgery. She is also on losartan , Singulair , amitriptyline , and tramadol , which she takes three times a day. She has a history of a blood clot diagnosed on September 06, 2024, and is scheduled to see a hematologist on December 05, 2024.    No recent chest pain or pressure. She denies any complications with previous surgeries.    She reports feeling some fluid in her ear, which she noticed yesterday, but denies any pain.         Functional capacity:  Can patient perform 4 METS (metabolic energy expenditure unit) without cardiac symptoms. Commonly used items are the ability to walk up 1 flight of stairs holding a bag of groceries or the ability to walk on level ground at 4 miles per hour (or 1 mile in 15 minutes).  Patient can walk up one flight of stairs holding a bag of groceries without experiencing chest pain or pressure.   Patient can walk on level ground at 4 miles per hour without experiencing chest pain or pressure.   Ability to perform these tasks without cardiac symptoms is consistent with a functional capacity of at least 4 METS indicating low risk of cardiac complications during an  intermediate risk surgery.    The history is provided by the patient.    Allergies:   Allergies[1]    Medications:   amitriptyline  (ELAVIL ) 50 mg Oral Tablet, Take 1 Tablet (50 mg total) by mouth Every night  apixaban  (ELIQUIS ) 5 mg Oral Tablet, Take 1 Tablet (5 mg total) by mouth Twice daily  atorvastatin  (LIPITOR) 20 mg Oral Tablet, Take 1 Tablet (20 mg total) by mouth Daily  beclomethasone dipropionate (QVAR REDIHALER) 80 mcg/actuation Inhalation oral inhaler, Take 1 Puff by inhalation Twice daily  desloratadine  (CLARINEX ) 5 mg Oral Tablet, Take 1 Tablet (5 mg total) by mouth Daily  ipratropium bromide  (ATROVENT ) 21 mcg (0.03 %) Nasal nasal spray, Administer 2 Sprays into affected nostril(s) Twice per day as needed  losartan  (COZAAR ) 50 mg Oral Tablet, Take 1 Tablet (50 mg total) by mouth Daily  montelukast  (SINGULAIR ) 10 mg Oral Tablet, Take 1 Tablet (10 mg total) by mouth Daily  omeprazole  (PRILOSEC) 40 mg Oral Capsule, Delayed Release(E.C.), Take 1 Capsule (40 mg total) by mouth Once per day as needed for Other  traMADol -acetaminophen  (ULTRACET ) 37.5-325 mg Oral Tablet, TAKE 1 TABLET BY MOUTH EVERY 8 HOURS AS NEEDED    No facility-administered medications prior to visit.    Immunization History:     Immunization History   Administered Date(s) Administered    COVID-19 VACCINE,MODERNA(SPIKEVAX),50MCG/0.5ML,12 YR+,SEASONAL 09/26/2022  Covid-19 Vaccine,Moderna,12 Years+ 01/13/2020, 02/10/2020    Influenza Vaccine, 6 month-adult 09/26/2022, 09/10/2023    Influenza Vaccine, 65+ (FLUAD) 10/04/2024    PREVNAR 13 10/24/2018     Past Medical History:     Past Medical History:   Diagnosis Date    Allergic rhinitis     Asthma     COPD (chronic obstructive pulmonary disease)     Esophageal reflux     Essential hypertension     Migraine      Past Surgical History:     Past Surgical History:   Procedure Laterality Date    HX TONSILLECTOMY      SINUS SURGERY       Family History:     Family Medical History:       Problem  Relation (Age of Onset)    Asthma Other    Breast Cancer Paternal Aunt    Cancer Other    Hearing Loss Other    Migraines Other    Sleep apnea Other    Thyroid  Disease Other          Social History:   Heather Atkins  reports that she has never smoked. She has never used smokeless tobacco. She reports current alcohol use of about 4 glasses of wine per week. She reports that she does not currently use drugs.  Review of Systems: Other than ROS in HPI, all other systems are negative.    Objective:   Vitals:   Vitals:    11/29/24 1409   BP: 108/68   Pulse: (!) 102   Resp: 18   Temp: 36.7 C (98 F)   TempSrc: Temporal   SpO2: 94%   Weight: 108 kg (238 lb 9.6 oz)     Physical Exam    CONSTITUTIONAL: Patient is not in acute distress. Normal appearance.  HENT: Mucous membranes are moist. Pupils are equal, round, and reactive to light. Fluid in ear.  CARDIOVASCULAR: Normal rate and regular rhythm. Normal pulses. Normal heart sounds. No murmur heard.  PULMONARY: Pulmonary effort is normal. Normal breath sounds.  MUSCULOSKELETAL: No swelling or deformity. No tenderness.  SKIN: Skin is warm and dry.  NEUROLOGICAL: No focal deficit present. Alert and oriented to person, place, and time.  PSYCHIATRIC: Mood and affect normal.       Laboratory:  A1C: 6.3  A1C Date: 10/04/2024    COMPLETE BLOOD COUNT   Lab Results   Component Value Date    WBC 6.0 11/24/2024    HGB 14.3 11/24/2024    HCT 41.2 11/24/2024    PLTCNT 301 11/24/2024     DIFFERENTIAL  Lab Results   Component Value Date    PMNS 62 10/04/2024    LYMPHOCYTES 24 10/04/2024    MONOCYTES 7 10/04/2024    EOSINOPHIL 6 10/04/2024    BASOPHILS 1 10/04/2024    BASOPHILS 0.10 10/04/2024    PMNABS 4.70 10/04/2024    LYMPHSABS 1.80 10/04/2024    EOSABS 0.50 10/04/2024    MONOSABS 0.50 10/04/2024     BASIC METABOLIC PANEL  Lab Results   Component Value Date    SODIUM 139 11/24/2024    POTASSIUM 4.2 11/24/2024    CHLORIDE 107 11/24/2024    CO2 26 11/24/2024    ANIONGAP 6 11/24/2024     BUN 15 11/24/2024    CREATININE 1.02 11/24/2024    BUNCRRATIO 15 11/24/2024    GFR 61 11/24/2024    CALCIUM 9.1 11/24/2024    GLUCOSENF 120 (H)  11/24/2024      LIPID PROFILE  Lab Results   Component Value Date    CHOLESTEROL 130 10/04/2024    HDLCHOL 56 10/04/2024    LDLCHOL 37 10/04/2024    TRIG 186 (H) 10/04/2024     LIVER TESTS  Lab Results   Component Value Date    ALBUMIN 3.9 10/04/2024    AST 13 10/04/2024    ALT 17 10/04/2024    ALKPHOS 46 10/04/2024    TOTBILIRUBIN 0.5 10/04/2024    BILIRUBINCON 0.08 10/04/2024     THYROID  STIMULATING HORMONE  Lab Results   Component Value Date    TSH 1.622 10/04/2024     Radiology  Right knee MRI: Early osteoarthritis and medial meniscal tear    Diagnostic  EKG (11/24/2024): Normal sinus rhythm, ventricular rate 93 beats per minute      Assessment & Plan     Preoperative clearance for right knee arthroscopy  Scheduled for right knee arthroscopy on January 21st, 2026. EKG normal. CBC and urinalysis unremarkable. Mild hyperglycemia noted. Surgery risk acceptable. Comorbidities optimized.  - Hold Eliquis  48 hours prior to surgery.  - Resume Eliquis  postoperative day one.  - No food or liquids the morning of surgery.  - Call surgeon if respiratory illness, fever, or other illness develops.    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 Previous treatments provided minimal relief. MRI shows early arthritis and tear. Arthroscopy planned for meniscus repair and debridement. Surgery may improve symptoms but not guarantee complete resolution.  - Continue tramadol  for pain management until surgery.  - Signed pain agreement.    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 Currently on Eliquis . Hematology appointment scheduled to assess need for continued anticoagulation.  - Attend hematology appointment on December 22nd, 2025.    Otitis externa, left  ear  Left ear otitis externa. No signs of infection.  - Prescribed acetic acid  drops for left ear.       Return if symptoms worsen or fail to improve.  [facility], APRN, CNP  This note was created with assistance from [employer] via capture of conversational audio. Consent was obtained from the patient and all parties present prior to recording.       [1]   Allergies  Allergen Reactions    Cat Dander     Cockroach     Dog Dander     Grass Pollen     Mold     Tetanus Vaccines And Toxoid     Tree And Shrub Pollen

## 2024-12-02 NOTE — Cancer Center Note (Signed)
 HEMATOLOGY/ONCOLOGY, [facility],   [address]  [place], [place] [address]       New Patient History and Physical    Name: Heather Atkins MRN:  [id number]   Date: 12/05/2024 Age: 68 y.o.  DOB: February 26, 1956       Referring Physician: No ref. provider found  Primary Care Provider: [facility], APRN, CNP    Reason for visit/consultation:   No chief complaint on file.        History of Present Illness:  The patient is a *** year old *** that has been referred to day for the evaluation and management of *** by ***.    Past Medical History  Past Medical History:   Diagnosis Date    Allergic rhinitis     Asthma     COPD (chronic obstructive pulmonary disease)     Esophageal reflux     Essential hypertension     Migraine       Past Surgical History:   Procedure Laterality Date    HX TONSILLECTOMY      SINUS SURGERY        Family Medical History:       Problem Relation (Age of Onset)    Asthma Other    Breast Cancer Paternal Aunt    Cancer Other    Hearing Loss Other    Migraines Other    Sleep apnea Other    Thyroid  Disease Other            Social History  Occupation:   Social History     Occupational History    Not on file    Hometown: [place] [address]  Social History     Socioeconomic History    Marital status: Married   Tobacco Use    Smoking status: Never    Smokeless tobacco: Never   Vaping Use    Vaping status: Never Used   Substance and Sexual Activity    Alcohol use: Yes     Alcohol/week: 4.0 standard drinks of alcohol     Types: 4 Glasses of wine per week    Drug use: Not Currently       Allergies[1]  Current Outpatient Medications   Medication Sig    acetic acid  (VOSOL ) 2 % Otic Solution Administer 5 Drops into the right ear Four times a day    amitriptyline  (ELAVIL ) 50 mg Oral Tablet Take 1 Tablet (50 mg total) by mouth Every night    apixaban  (ELIQUIS ) 5 mg Oral Tablet Take 1 Tablet (5 mg total) by mouth Twice daily    atorvastatin  (LIPITOR) 20 mg Oral Tablet Take 1  Tablet (20 mg total) by mouth Daily    beclomethasone dipropionate (QVAR REDIHALER) 80 mcg/actuation Inhalation oral inhaler Take 1 Puff by inhalation Twice daily    desloratadine  (CLARINEX ) 5 mg Oral Tablet Take 1 Tablet (5 mg total) by mouth Daily    ipratropium bromide  (ATROVENT ) 21 mcg (0.03 %) Nasal nasal spray Administer 2 Sprays into affected nostril(s) Twice per day as needed    losartan  (COZAAR ) 50 mg Oral Tablet Take 1 Tablet (50 mg total) by mouth Daily    montelukast  (SINGULAIR ) 10 mg Oral Tablet Take 1 Tablet (10 mg total) by mouth Daily    omeprazole  (PRILOSEC) 40 mg Oral Capsule, Delayed Release(E.C.) Take 1 Capsule (40 mg total) by mouth Once per day as needed for Other    traMADol -acetaminophen  (ULTRACET ) 37.5-325 mg Oral Tablet TAKE 1  TABLET BY MOUTH EVERY 8 HOURS AS NEEDED           ROS:   Review of Systems - Oncology    Physical Examination:  There were no vitals taken for this visit.      Physical Exam   ECOG Status: {ECOG:[id number]}    Labs:   CBC  Diff   Lab Results   Component Value Date/Time    WBC 6.0 11/24/2024 10:25 AM    HGB 14.3 11/24/2024 10:25 AM    HCT 41.2 11/24/2024 10:25 AM    PLTCNT 301 11/24/2024 10:25 AM    RBC 4.44 11/24/2024 10:25 AM    MCV 92.8 11/24/2024 10:25 AM    MCHC 34.7 11/24/2024 10:25 AM    MCH 32.2 11/24/2024 10:25 AM    RDW 12.9 11/24/2024 10:25 AM    MPV 8.3 11/24/2024 10:25 AM    Lab Results   Component Value Date/Time    PMNS 62 10/04/2024 02:04 PM    LYMPHOCYTES 24 10/04/2024 02:04 PM    EOSINOPHIL 6 10/04/2024 02:04 PM    MONOCYTES 7 10/04/2024 02:04 PM    BASOPHILS 1 10/04/2024 02:04 PM    BASOPHILS 0.10 10/04/2024 02:04 PM    PMNABS 4.70 10/04/2024 02:04 PM    LYMPHSABS 1.80 10/04/2024 02:04 PM    EOSABS 0.50 10/04/2024 02:04 PM    MONOSABS 0.50 10/04/2024 02:04 PM            Comprehensive Metabolic Profile    Lab Results   Component Value Date    SODIUM 139 11/24/2024    POTASSIUM 4.2 11/24/2024    CHLORIDE 107 11/24/2024    CO2 26 11/24/2024    ANIONGAP 6  11/24/2024    BUN 15 11/24/2024    CREATININE 1.02 11/24/2024    ALBUMIN 3.9 10/04/2024    CALCIUM 9.1 11/24/2024    GLUCOSENF 120 (H) 11/24/2024    ALKPHOS 46 10/04/2024    ALT 17 10/04/2024    AST 13 10/04/2024    TOTBILIRUBIN 0.5 10/04/2024    TOTALPROTEIN 6.0 (L) 10/04/2024              Pathology:  ***    Radiology:  ***    Assessment/Plan:     Problem List Items Addressed This Visit    None    Oncology History    No problem history exists.        PLAN:  All relative external and internal medical records were reviewed including available H&Ps, progress notes, procedure notes, imaging's, laboratories, and pathology   ***      The patient was given the opportunity to ask questions, and these were answered to their satisfaction. Mava A Pedretti  is welcome to call with any questions or concerns at any point.        No follow-ups on file.    On the day of the encounter, I spent a total of  *** minutes on this patient encounter including review of historical information, examination, documentation and post-visit activities.           Romualdo CHRISTELLA Moose APRN, FNP-BC 12/19/202513:40    You can see your note(s) in [facility]. It is common for you to encounter certain medical terminology which may be unfamiliar to you. You might see results before your provider does so please give at least 2 business days for review. Please have this understanding, that NOT all abnormal results are significant. Our office will contact you for any urgent or  emergent action if necessary. If you have any questions or concerns, feel free to send a MyChart message or call the office. Please call with any new or concerning symptoms.       CC:  [facility], APRN, CNP  [address]  [place] [address]    No primary care provider on file.    Portions of this note may be dictated using voice recognition software or a dictation service. Variances in spelling and vocabulary are possible and unintentional. Not all errors are caught/corrected.  Please notify the dino if any discrepancies are noted or if the meaning of any statement is not clear.           [1]   Allergies  Allergen Reactions    Cat Dander     Cockroach     Dog Dander     Grass Pollen     Mold     Tetanus Vaccines And Toxoid     Tree And Shrub Pollen

## 2024-12-05 ENCOUNTER — Encounter (INDEPENDENT_AMBULATORY_CARE_PROVIDER_SITE_OTHER): Payer: Self-pay | Admitting: NURSE PRACTITIONER

## 2024-12-05 ENCOUNTER — Ambulatory Visit: Payer: Self-pay | Attending: NURSE PRACTITIONER | Admitting: NURSE PRACTITIONER

## 2024-12-05 ENCOUNTER — Ambulatory Visit (INDEPENDENT_AMBULATORY_CARE_PROVIDER_SITE_OTHER): Admission: RE | Admit: 2024-12-05 | Payer: Self-pay | Source: Ambulatory Visit

## 2024-12-05 ENCOUNTER — Other Ambulatory Visit: Payer: Self-pay

## 2024-12-05 VITALS — BP 135/84 | HR 96 | Temp 98.7°F | Ht 62.0 in | Wt 233.0 lb

## 2024-12-05 DIAGNOSIS — Z09 Encounter for follow-up examination after completed treatment for conditions other than malignant neoplasm: Secondary | ICD-10-CM | POA: Insufficient documentation

## 2024-12-05 DIAGNOSIS — I82409 Acute embolism and thrombosis of unspecified deep veins of unspecified lower extremity: Secondary | ICD-10-CM

## 2024-12-05 DIAGNOSIS — Z86718 Personal history of other venous thrombosis and embolism: Secondary | ICD-10-CM | POA: Insufficient documentation

## 2024-12-05 DIAGNOSIS — D6859 Other primary thrombophilia: Secondary | ICD-10-CM

## 2024-12-05 DIAGNOSIS — Z7901 Long term (current) use of anticoagulants: Secondary | ICD-10-CM | POA: Insufficient documentation

## 2024-12-06 ENCOUNTER — Other Ambulatory Visit (INDEPENDENT_AMBULATORY_CARE_PROVIDER_SITE_OTHER): Payer: Self-pay

## 2024-12-06 MED ORDER — TRAMADOL 37.5 MG-ACETAMINOPHEN 325 MG TABLET: 1.0000 | ORAL_TABLET | Freq: Three times a day (TID) | ORAL | 0 refills | Status: DC

## 2024-12-26 ENCOUNTER — Ambulatory Visit: Payer: Self-pay | Attending: PHYSICIAN ASSISTANT

## 2024-12-26 ENCOUNTER — Ambulatory Visit (HOSPITAL_COMMUNITY): Admission: RE | Admit: 2024-12-26 | Discharge: 2024-12-26 | Disposition: A | Source: Ambulatory Visit

## 2024-12-26 ENCOUNTER — Other Ambulatory Visit: Payer: Self-pay

## 2024-12-26 ENCOUNTER — Encounter (INDEPENDENT_AMBULATORY_CARE_PROVIDER_SITE_OTHER): Payer: Self-pay

## 2024-12-26 VITALS — BP 139/86 | HR 112 | Temp 97.6°F | Resp 18 | Wt 232.6 lb

## 2024-12-26 DIAGNOSIS — R590 Localized enlarged lymph nodes: Secondary | ICD-10-CM | POA: Insufficient documentation

## 2024-12-26 DIAGNOSIS — M25571 Pain in right ankle and joints of right foot: Secondary | ICD-10-CM | POA: Insufficient documentation

## 2024-12-26 DIAGNOSIS — H6692 Otitis media, unspecified, left ear: Secondary | ICD-10-CM | POA: Insufficient documentation

## 2024-12-26 MED ORDER — AMOXICILLIN 875 MG-POTASSIUM CLAVULANATE 125 MG TABLET: 1.0000 | ORAL_TABLET | Freq: Two times a day (BID) | ORAL | 1 refills | Status: AC

## 2024-12-26 MED ORDER — PREDNISONE 5 MG TABLET: 5.0000 mg | ORAL_TABLET | Freq: Every day | ORAL | 0 refills | Status: AC

## 2024-12-26 NOTE — Progress Notes (Signed)
 FAMILY MEDICINE, MEDICAL OFFICE BUILDING  [address]  [place] [address]  Operated by [facility]    Heather Atkins  [birth year]  [id number]    Date of Service: 12/26/2024  2:00 PM EST  Chief complaint:   Chief Complaint   Patient presents with    Ear Pain     Ear pain has been about a week its her left ear and sore down in jaw     Ankle Injury     Pt states her ankle has been hurting since Friday      Subjective:   History of Present Illness    Heather Atkins is a 69 year old female who presents with left earache and right ankle pain.    She has been experiencing left ear pain for the past week, described as extending down the jaw with a sensation of the ear being clogged and reports difficulty hearing. She also reports tenderness in the left submandibular area.    In addition to the earache, she reports a right ankle injury that began on Friday with sharp pain in the ankle bone upon standing, without a known inciting event. The pain is significant, surpassing her usual knee pain. She has been managing the pain with elevation and ice but notes persistent pain in the right ankle. She has a family history of gout but notes that her current symptoms do not include redness or heat, which she associates with previous gout episodes.       Review of Systems:  Any pertinent Review of Systems as addressed in the HPI above.    Objective   BP 139/86   Pulse (!) 112   Temp 36.4 C (97.6 F) (Temporal)   Resp 18   Wt 106 kg (232 lb 9.6 oz)   SpO2 96%   BMI 42.54 kg/m     Physical Exam    CONSTITUTIONAL: Patient is not in acute distress. Normal appearance.  CARDIOVASCULAR: Normal rate and regular rhythm. No murmur heard.  PULMONARY: Pulmonary effort is normal. Normal breath sounds.  SKIN: Skin is warm and dry.  NEUROLOGICAL: No focal deficit present.  PSYCHIATRIC: Mood and affect normal.  HENT: No cerumen impaction in left ear.  MUSCULOSKELETAL: Right ankle swollen and tender with decreased range  of motion.  LYMPHADENOPATHY: Left submandibular lymphadenopathy.           ICD-10-CM    1. Left otitis media  H66.92       2. Submandibular lymphadenopathy  R59.0       3. Right ankle pain  M25.571 XR ANKLE RIGHT         Assessment & Plan     Left otitis media  Left ear pain and decreased hearing due to otitis media.   - Prescribed low dose prednisone  for a few days.  - Prescribed Augmentin  for 5 days.  - Advised to inform hospital pre-op team about antibiotic use.    Right ankle pain and swelling  Acute right ankle pain and swelling.  - Ordered right ankle x-ray.  - Advised use of compression bandages and ice for swelling.  - Prescribed steroids to reduce swelling.        The patient was given the opportunity to ask questions and those questions were answered to the patient's satisfaction. The patient was encouraged to call with any additional questions or concerns. I instructed the patient to follow-up if symptoms persist or worsen. Medication safety was discussed. A good faith effort  was made to reconcile the patient's medications. More than 50% of the visit was spent counseling and coordinating care.    Orders Placed This Encounter    XR ANKLE RIGHT    predniSONE  (DELTASONE ) 5 mg Oral Tablet    amoxicillin -pot clavulanate (AUGMENTIN ) 875-125 mg Oral Tablet     Follow up: Return if symptoms worsen or fail to improve.  [employer] [facility], APRN, CNP   This note was created with assistance from [employer] via capture of conversational audio. Consent was obtained from the patient and all parties present prior to recording.

## 2024-12-27 ENCOUNTER — Ambulatory Visit (INDEPENDENT_AMBULATORY_CARE_PROVIDER_SITE_OTHER): Payer: Self-pay

## 2025-01-03 ENCOUNTER — Encounter (HOSPITAL_COMMUNITY): Payer: Self-pay | Admitting: Orthopaedic Surgery

## 2025-01-04 ENCOUNTER — Other Ambulatory Visit (INDEPENDENT_AMBULATORY_CARE_PROVIDER_SITE_OTHER): Payer: Self-pay

## 2025-01-04 MED ORDER — TRAMADOL 37.5 MG-ACETAMINOPHEN 325 MG TABLET: 1.0000 | ORAL_TABLET | Freq: Three times a day (TID) | ORAL | 0 refills | Status: AC

## 2025-01-05 ENCOUNTER — Other Ambulatory Visit (INDEPENDENT_AMBULATORY_CARE_PROVIDER_SITE_OTHER): Payer: Self-pay

## 2025-01-24 ENCOUNTER — Encounter (HOSPITAL_COMMUNITY): Payer: Self-pay

## 2025-01-24 ENCOUNTER — Ambulatory Visit: Admit: 2025-01-24 | Admitting: Orthopaedic Surgery

## 2025-03-07 ENCOUNTER — Ambulatory Visit (INDEPENDENT_AMBULATORY_CARE_PROVIDER_SITE_OTHER): Payer: Self-pay | Admitting: NURSE PRACTITIONER

## 2025-04-04 ENCOUNTER — Ambulatory Visit (INDEPENDENT_AMBULATORY_CARE_PROVIDER_SITE_OTHER): Payer: Self-pay

## 2025-10-06 ENCOUNTER — Ambulatory Visit (INDEPENDENT_AMBULATORY_CARE_PROVIDER_SITE_OTHER): Payer: Self-pay
